# Patient Record
Sex: Female | Born: 1975 | Race: White | Hispanic: No | Marital: Single | Smoking: Never smoker
Health system: Southern US, Community
[De-identification: ages and names within clinical notes are randomized; demographics above are authoritative.]

## PROBLEM LIST (undated history)

## (undated) DIAGNOSIS — J189 Pneumonia, unspecified organism: Secondary | ICD-10-CM

## (undated) DIAGNOSIS — C50919 Malignant neoplasm of unspecified site of unspecified female breast: Secondary | ICD-10-CM

## (undated) HISTORY — PX: FRACTURE SURGERY: SHX138

## (undated) HISTORY — DX: Malignant neoplasm of unspecified site of unspecified female breast: C50.919

## (undated) HISTORY — PX: OTHER SURGICAL HISTORY: SHX169

## (undated) HISTORY — PX: AUGMENTATION MAMMAPLASTY: SUR837

---

## 2001-12-18 HISTORY — PX: AUGMENTATION MAMMAPLASTY: SUR837

## 2007-05-06 ENCOUNTER — Ambulatory Visit: Payer: Self-pay | Admitting: Internal Medicine

## 2007-05-08 ENCOUNTER — Inpatient Hospital Stay: Payer: Self-pay | Admitting: Internal Medicine

## 2007-05-11 ENCOUNTER — Encounter: Payer: Self-pay | Admitting: Cardiothoracic Surgery

## 2007-05-11 ENCOUNTER — Inpatient Hospital Stay (HOSPITAL_COMMUNITY): Admission: EM | Admit: 2007-05-11 | Discharge: 2007-05-17 | Payer: Self-pay | Admitting: Cardiothoracic Surgery

## 2007-05-11 ENCOUNTER — Ambulatory Visit: Payer: Self-pay | Admitting: Infectious Diseases

## 2007-05-12 ENCOUNTER — Ambulatory Visit: Payer: Self-pay | Admitting: Cardiothoracic Surgery

## 2007-05-30 ENCOUNTER — Ambulatory Visit: Payer: Self-pay | Admitting: Cardiothoracic Surgery

## 2007-06-06 ENCOUNTER — Ambulatory Visit: Payer: Self-pay | Admitting: Cardiothoracic Surgery

## 2007-06-06 ENCOUNTER — Encounter: Admission: RE | Admit: 2007-06-06 | Discharge: 2007-06-06 | Payer: Self-pay | Admitting: Cardiothoracic Surgery

## 2007-07-04 ENCOUNTER — Ambulatory Visit: Payer: Self-pay | Admitting: Cardiothoracic Surgery

## 2007-07-04 ENCOUNTER — Encounter: Admission: RE | Admit: 2007-07-04 | Discharge: 2007-07-04 | Payer: Self-pay | Admitting: Cardiothoracic Surgery

## 2008-05-18 HISTORY — PX: CHEST TUBE INSERTION: SHX231

## 2008-12-11 IMAGING — CT CT CHEST W/ CM
1 series · 15 of 33 positions shown, 19 images · non-contrast
Comparison: none

REASON FOR EXAM: empyema,s/p chest tube placement
COMMENTS:

[Series 2: soft tissue · axial · 0.68mm/px · z∈[-336,-62]mm · 15 of 65 slices shown, 19 images]
[im 5/65  mediastinal]
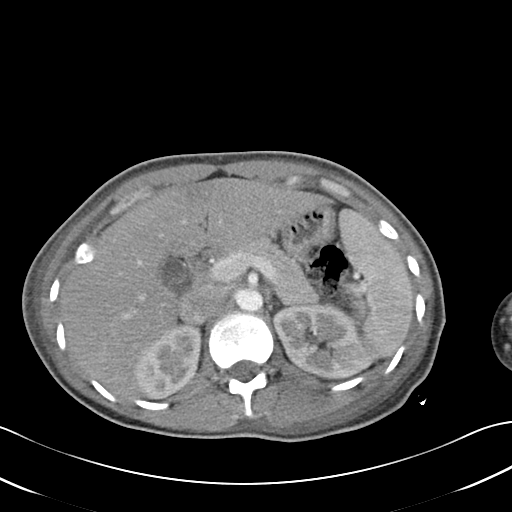
[im 5/65  lung]
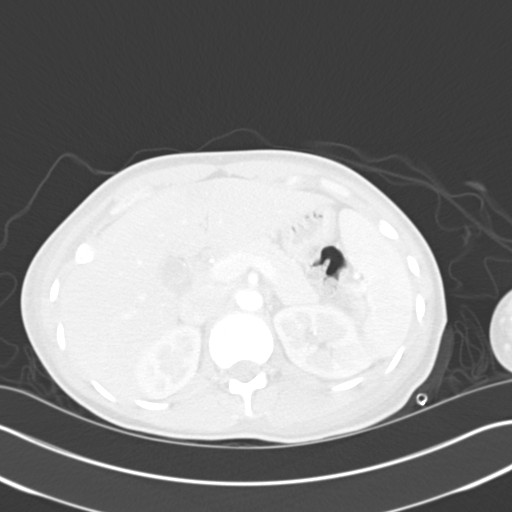
[im 10/65  lung]
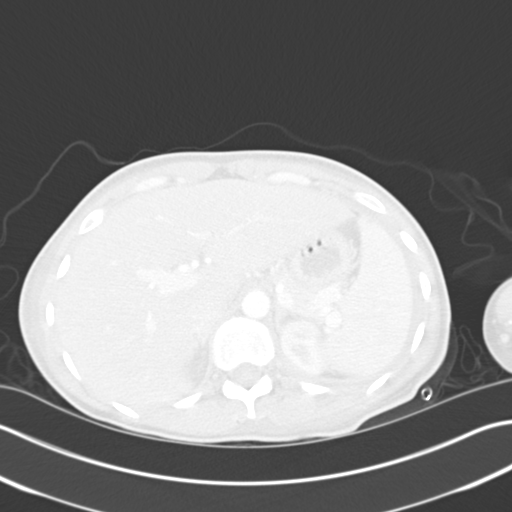
[im 13/65  lung]
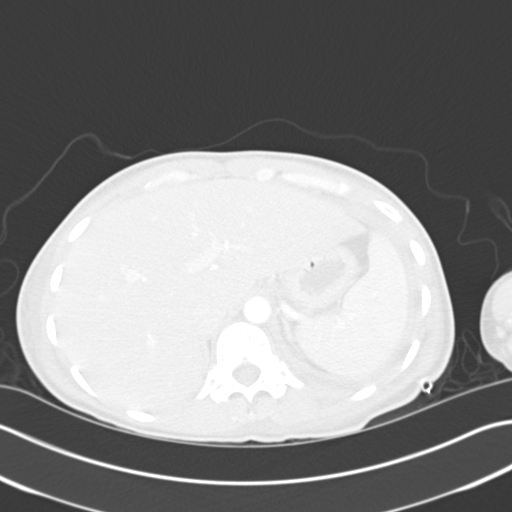
[im 17/65  lung]
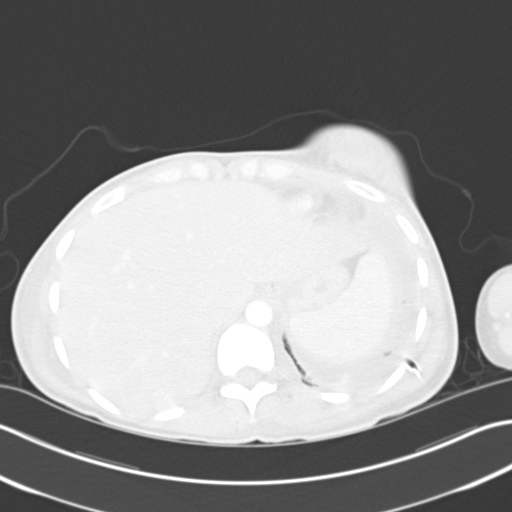
[im 22/65  mediastinal]
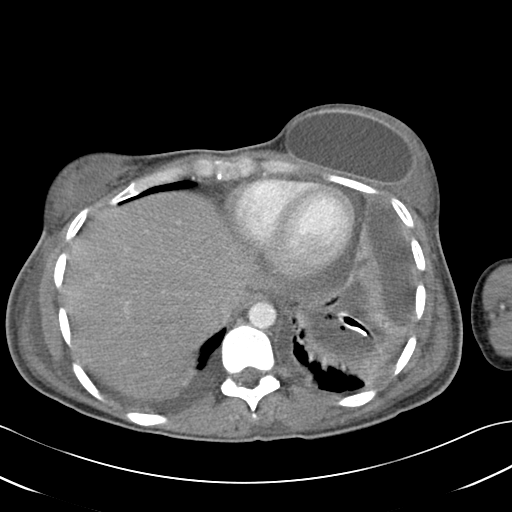
[im 22/65  lung]
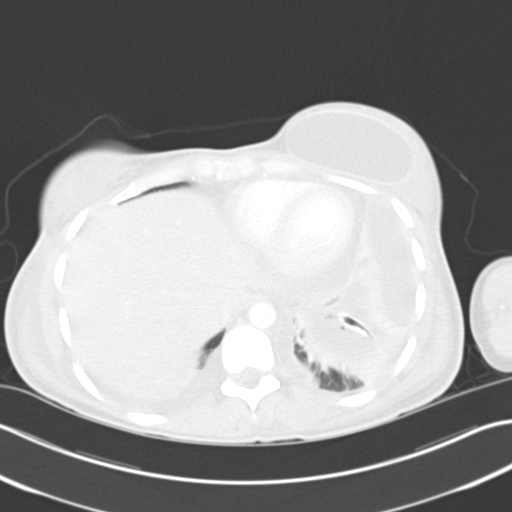
[im 26/65  lung]
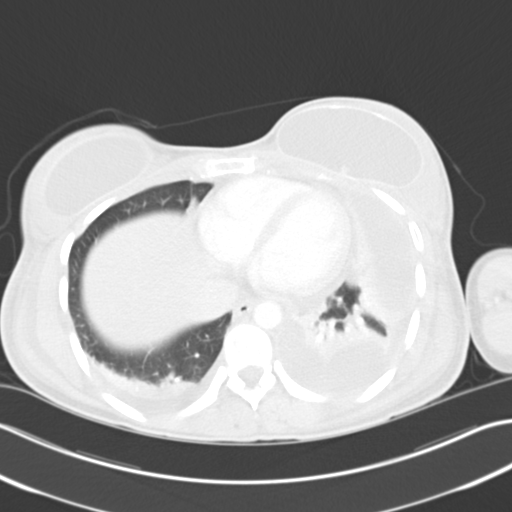
[im 29/65  lung]
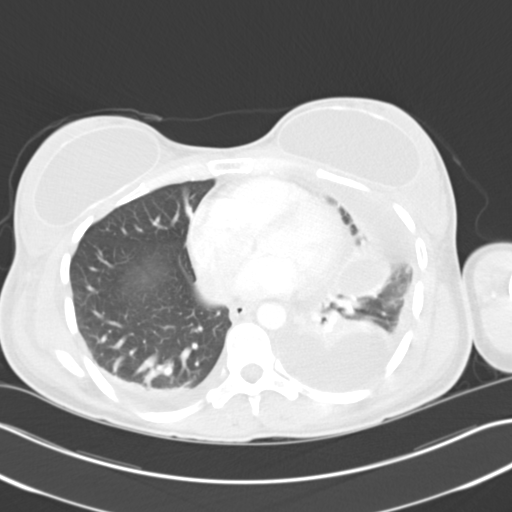
[im 34/65  lung]
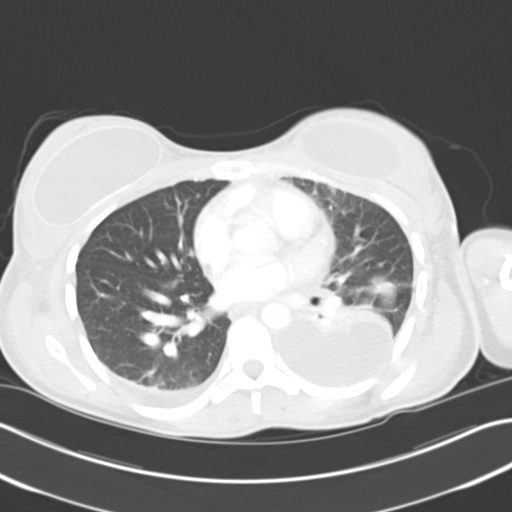
[im 36/65  mediastinal]
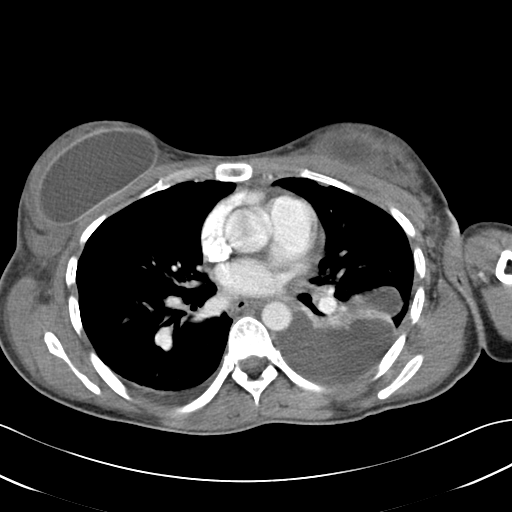
[im 36/65  lung]
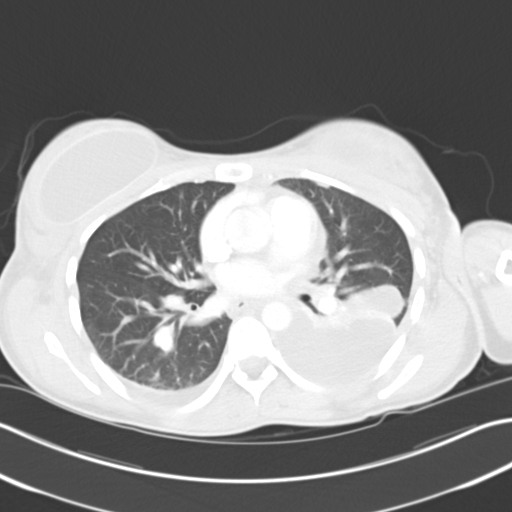
[im 39/65  lung]
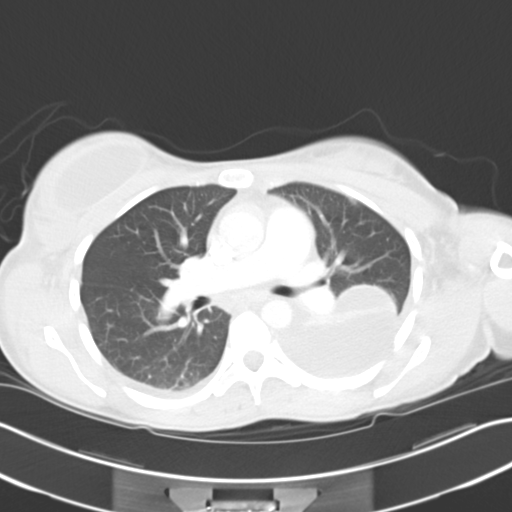
[im 43/65  lung]
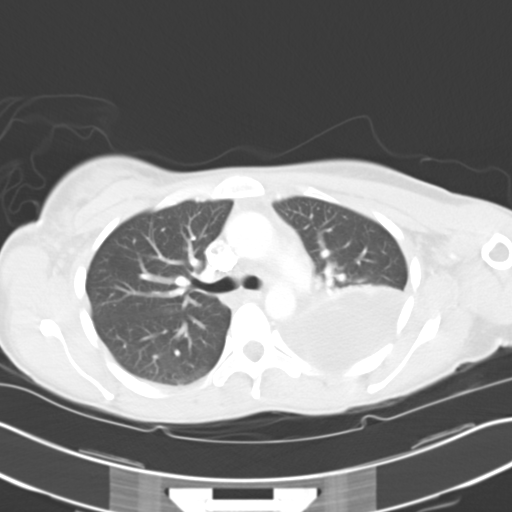
[im 48/65  lung]
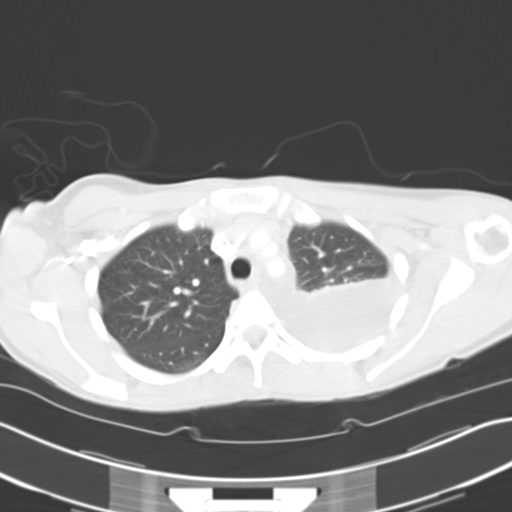
[im 52/65  mediastinal]
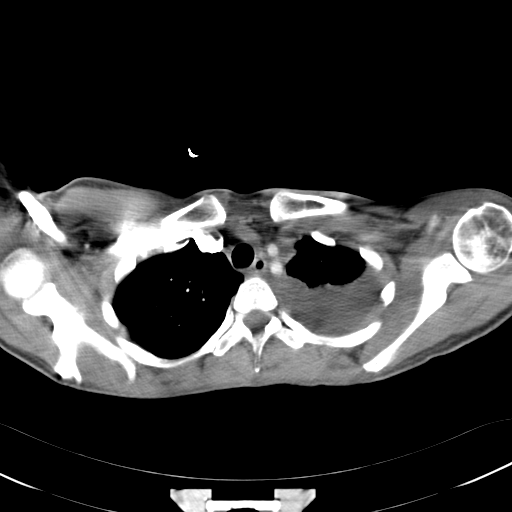
[im 52/65  lung]
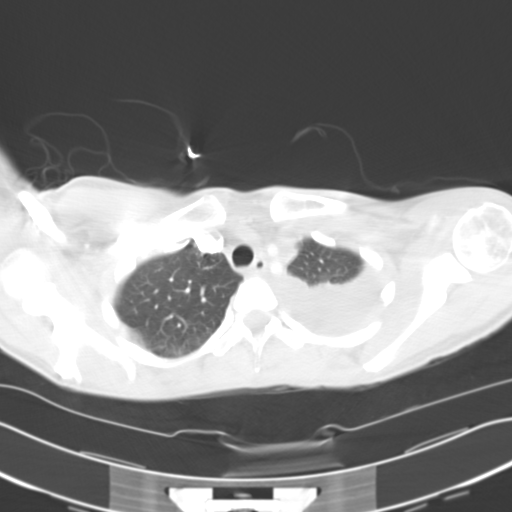
[im 55/65  lung]
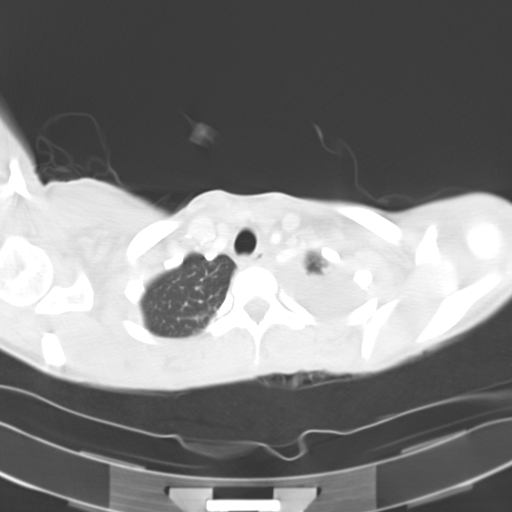
[im 60/65  lung]
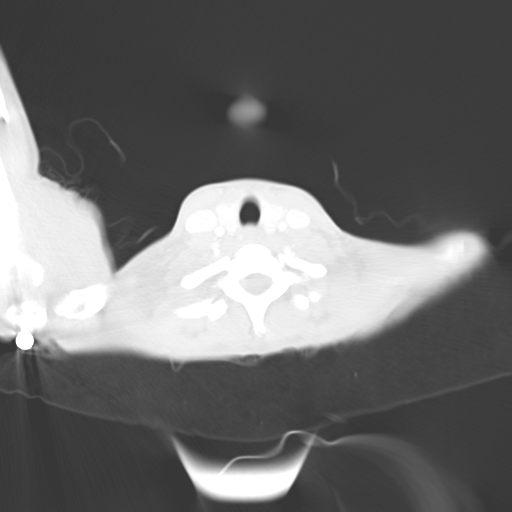

[15 of 33 positions shown; findings below may reference images not displayed]

PROCEDURE:     CT  - CT CHEST WITH CONTRAST  - May 11, 2007  [DATE]

RESULT:     The patient received 75 ml of 2sovue-SKS for this study. The
patent is undergoing re-evaluation of a known empyema. A chest tube has been
placed since the study [DATE]. The tip of the tube lies just above the
dome of the diaphragm along its mid portion. There has developed increased
fluid to a moderate degree in the posterior costophrenic gutter. It may be
that it is loculated and not being drained. There is more fluid that is
layering in the dependent portion of the hemithorax as one moves more
superiorly. Repositioning of the chest tube may be indicated. There is a
small amount of atelectasis and fluid that has developed in the RIGHT
posterior costophrenic gutter which is new. The cardiac chambers are normal
in size. The caliber of the thoracic aorta is normal. The central pulmonary
vascularity exhibits no acute abnormality. I see no bulky mediastinal or
hilar lymph nodes. Consolidation of portions of the LEFT lower lobe persists
and has progressed slightly. The RIGHT lung remains clear with the exception
of a small amount of atelectasis in the lung base.
IMPRESSION: 1.There is persistent pleural fluid on the LEFT in this patient with known
empyema. The chest tube does not appear to be draining significantly this
fluid. The fluid lies superior and posterior to the position of the tip of
the tube. There is also increased fluid seen laterally along the inferior
hemithorax that is not being drained by the tube.
2. Increased consolidation of portions of the LEFT lower lobe is seen
consistent with progressive pneumonia.
3. A new small RIGHT pleural effusion and small amount of RIGHT basilar
atelectasis is seen.
4. I see no overt evidence of CHF.

## 2008-12-11 IMAGING — CR DG CHEST 1V PORT
1 series · 1 of 1 positions shown · non-contrast
Comparison: None.

Exam: Chest, one view.

HISTORY: Empyema.

[view not recorded]
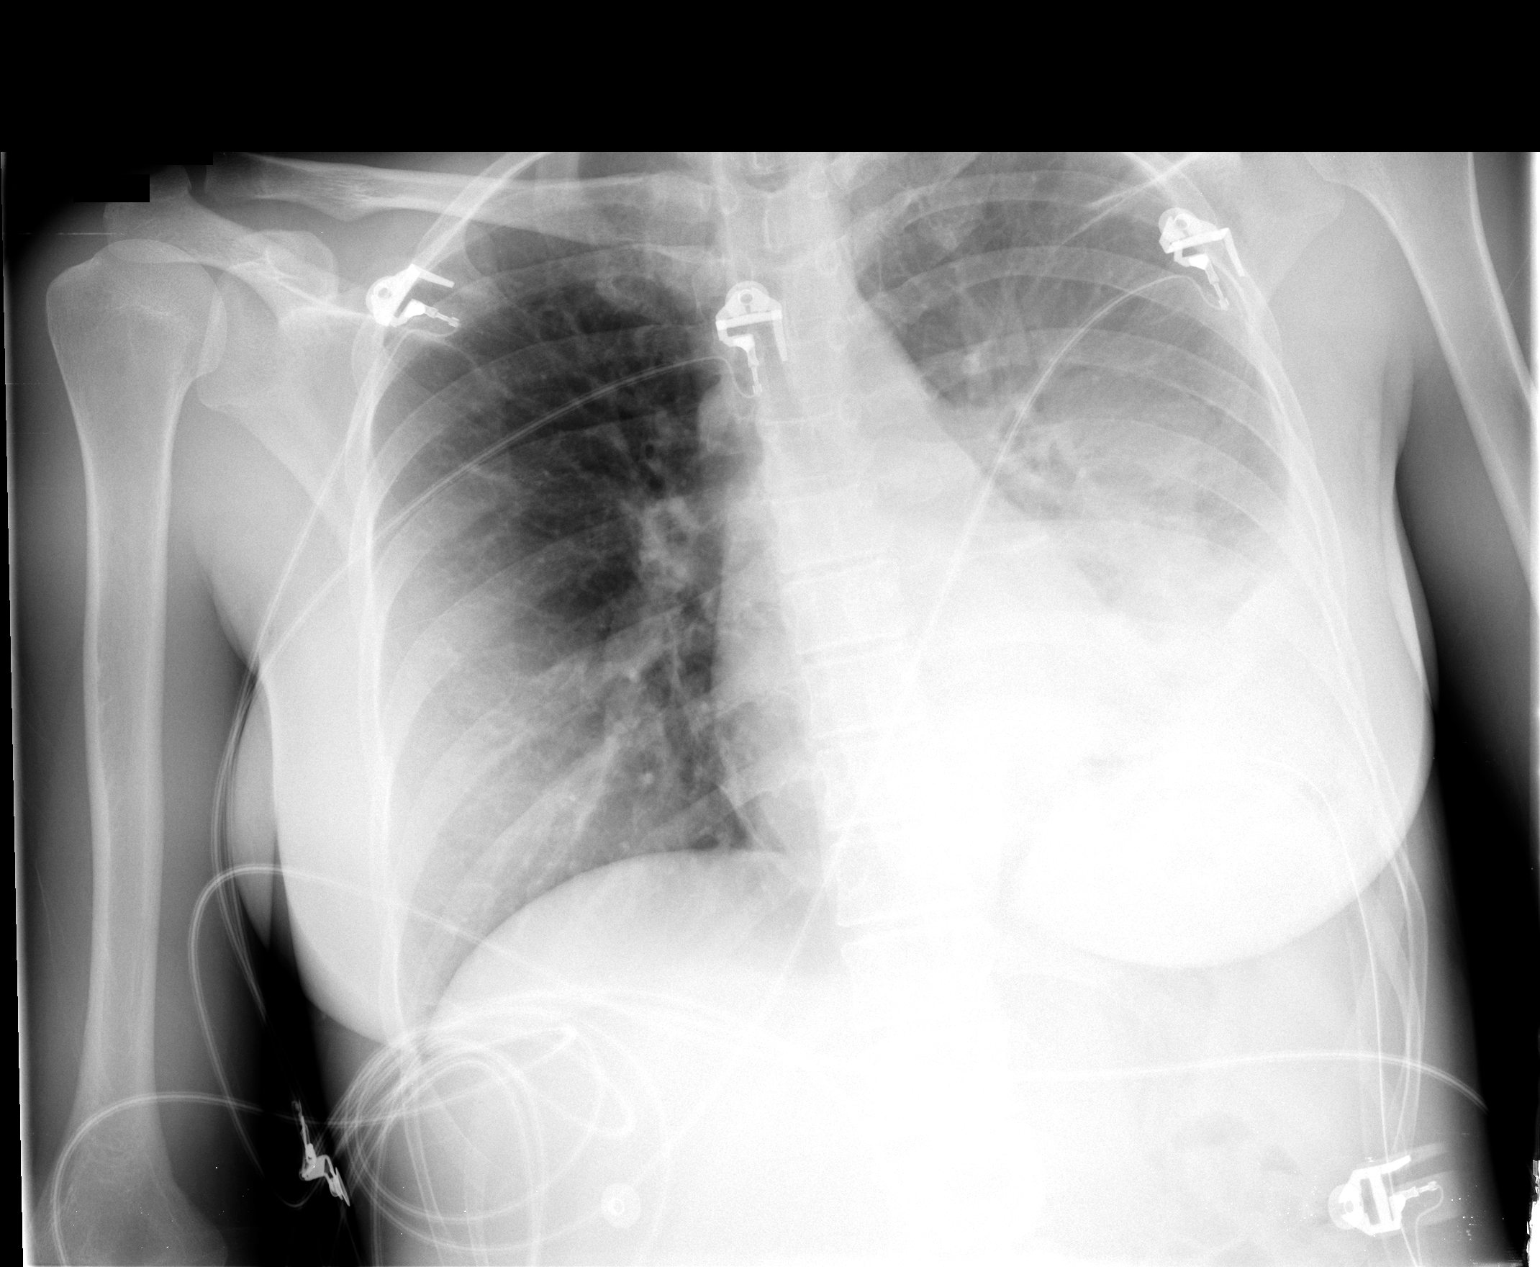

[1 of 1 positions shown; findings below may reference images not displayed]

FINDINGS: There is a left-sided chest tube.

No pneumothorax is identified. 

Moderate left-sided pleural effusion.

Left lower lobe atelectasis and/or consolidation is also noted.
IMPRESSION: 1. Moderate left effusion. 
2. No pneumothorax after chest tube placement.

## 2008-12-14 IMAGING — CR DG CHEST 1V PORT
1 series · 1 of 1 positions shown · non-contrast
Comparison: 05/13/2007

CLINICAL DATA: Empyema, postop

PORTABLE CHEST - 1 VIEW:

[view not recorded]
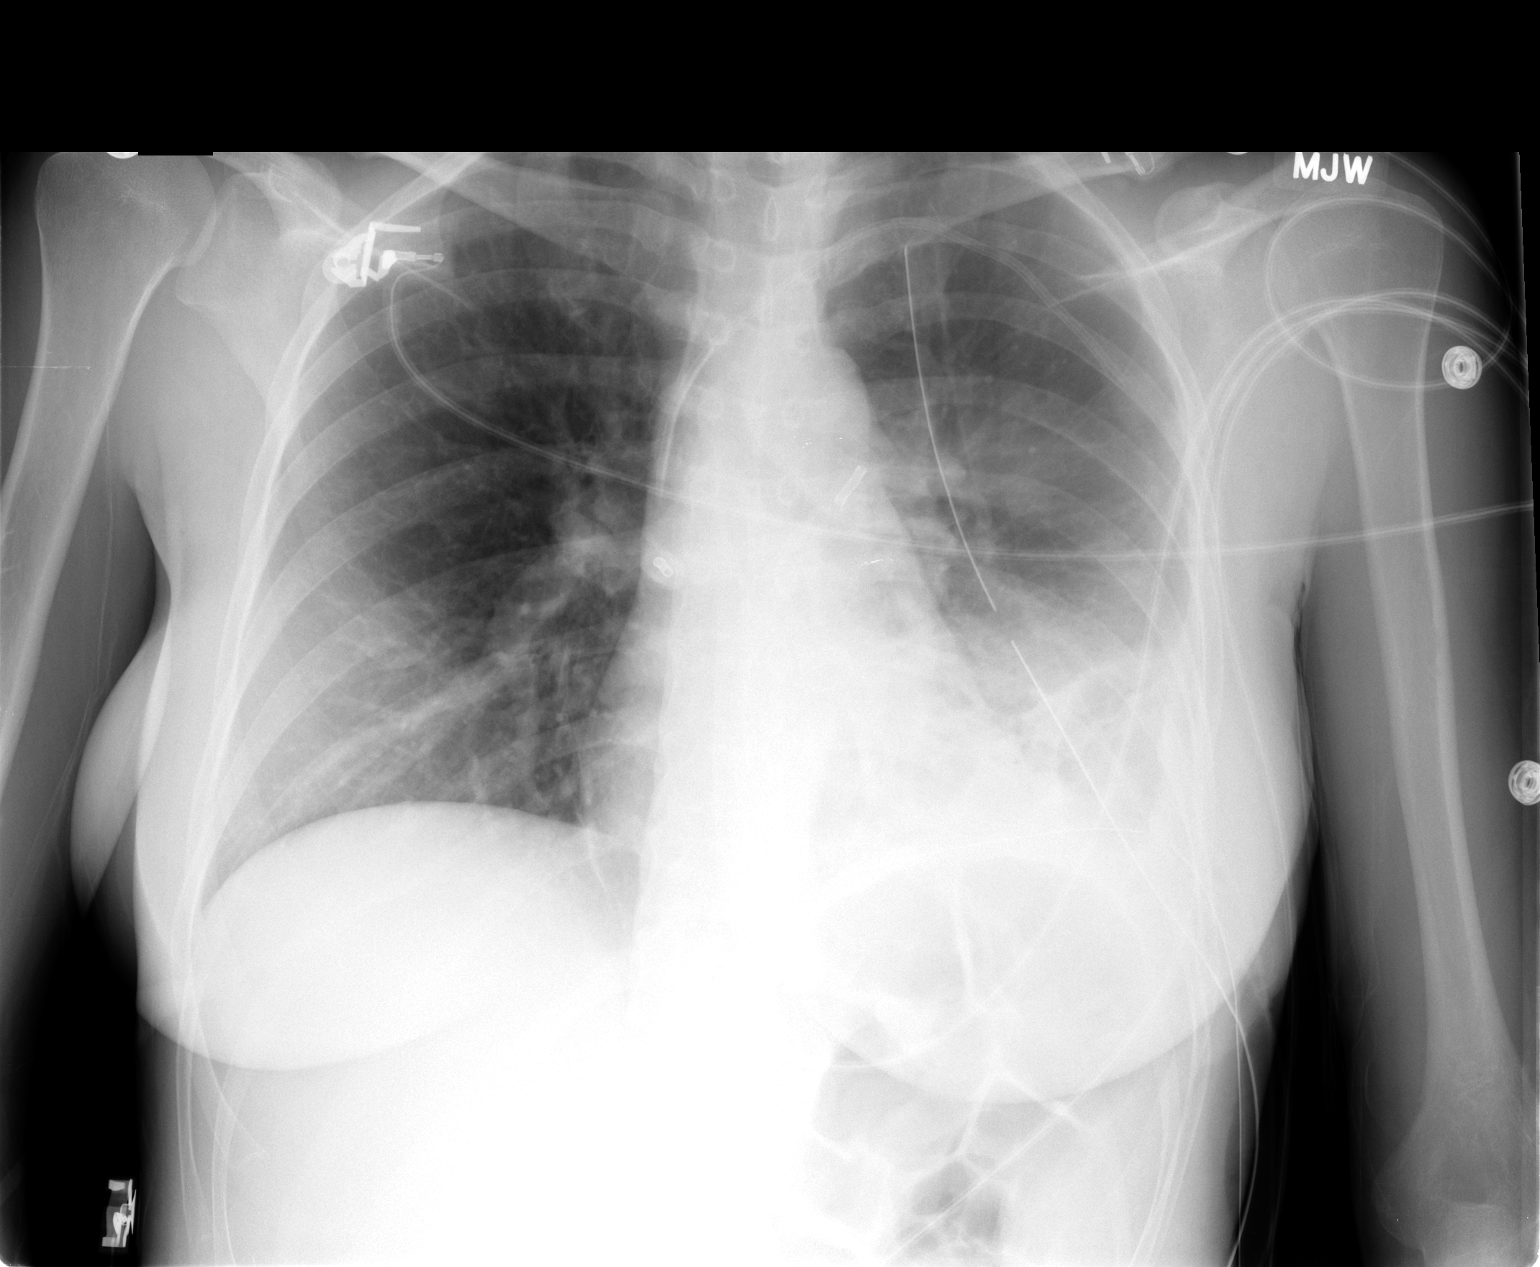

[1 of 1 positions shown; findings below may reference images not displayed]

FINDINGS: 2 left chest tubes remain in place. There is a tiny left apical
pneumothorax, less than 5%. Stable left lower lobe airspace opacity and left
effusion. Right lung clear. Heart is normal size.
IMPRESSION: Small left apical pneumothorax. Otherwise no change.

## 2008-12-17 IMAGING — CR DG CHEST 2V
2 series · 2 of 2 positions shown · non-contrast
Comparison: 05/16/2007.

CLINICAL DATA: Post-op, history of empyema.
CHEST - 2 VIEW:

[w chest pa]
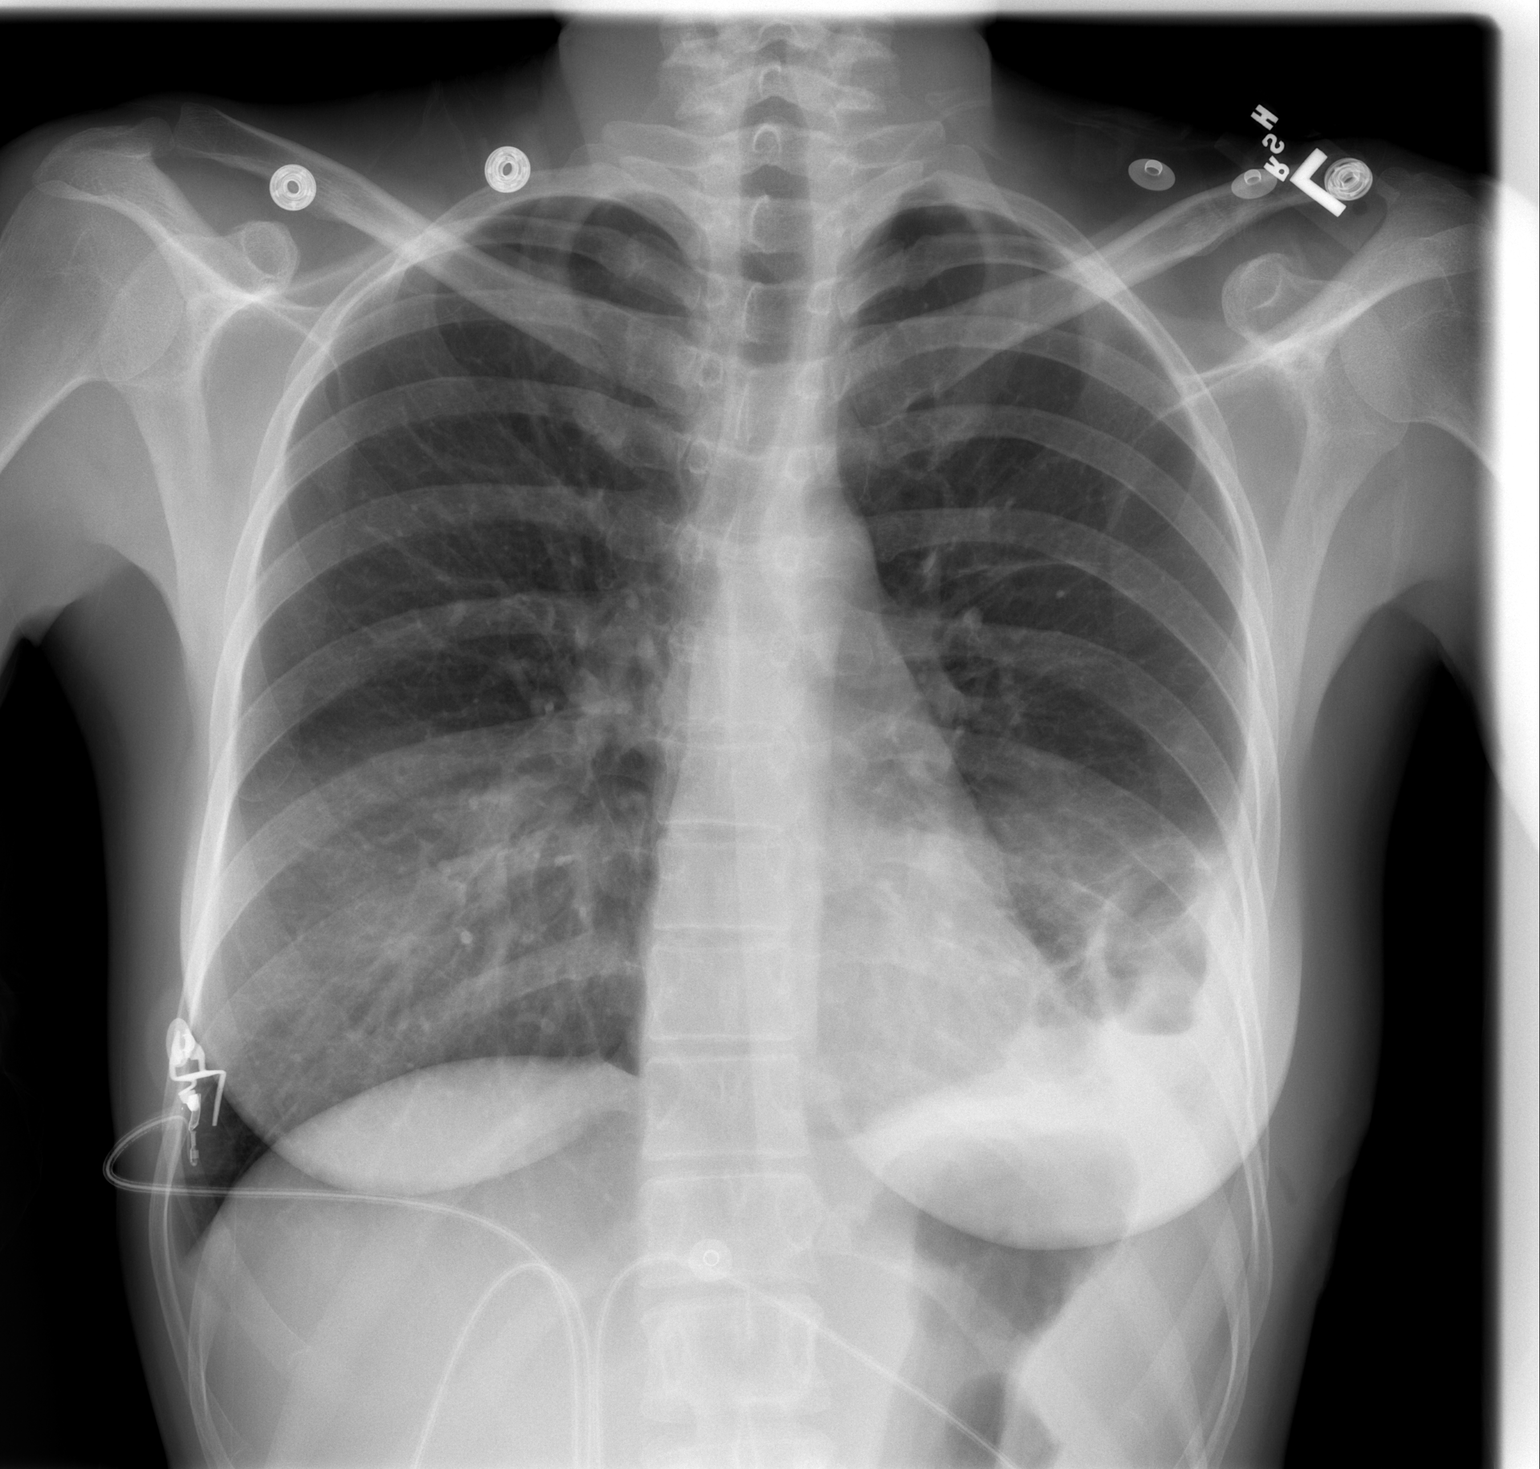

[w chest lat]
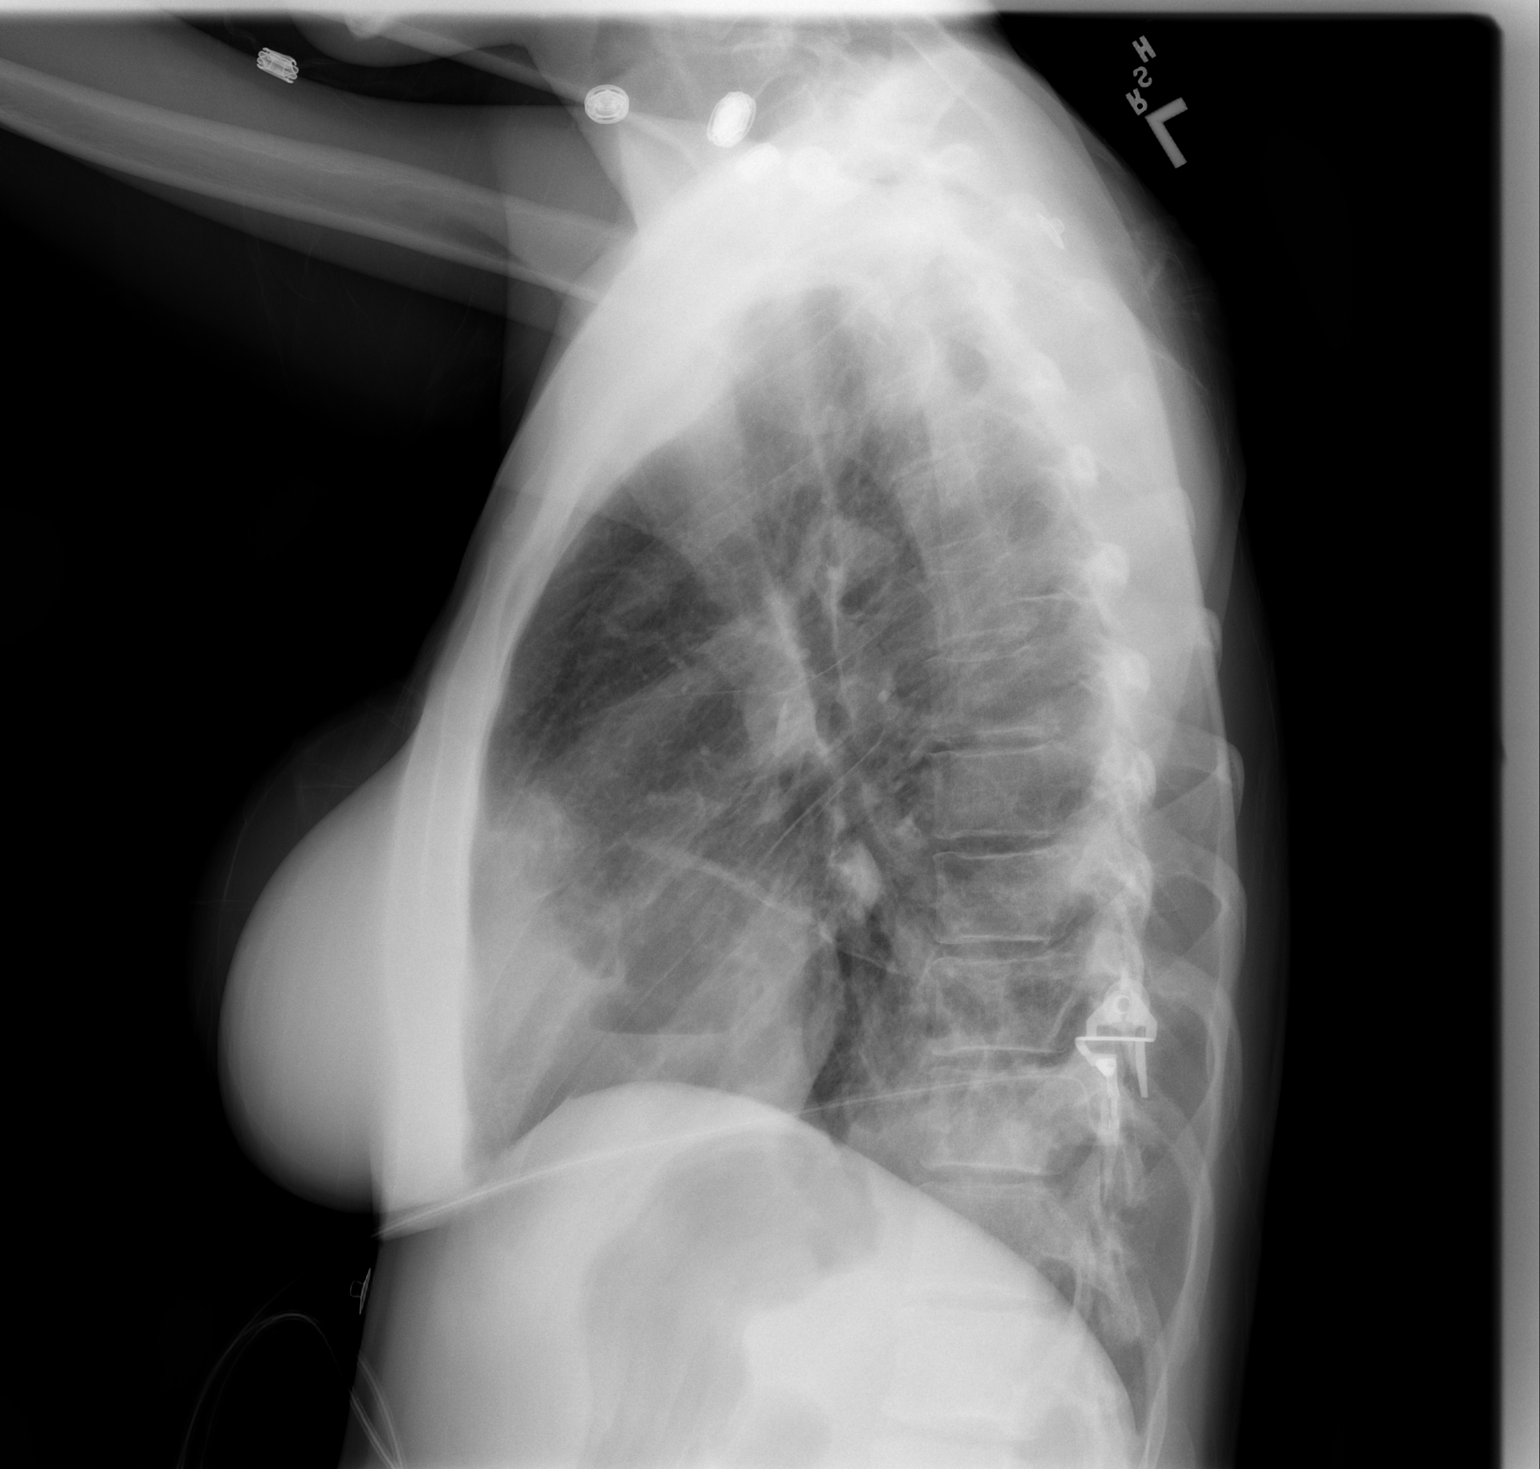

[2 of 2 positions shown; findings below may reference images not displayed]

FINDINGS: Trachea is midline.  Heart size normal.  Collection of pleural air and fluid is again seen at the left costophrenic angle.  Left basilar atelectasis.  Lungs otherwise clear.
IMPRESSION: Stable left hydropneumothorax in this patient with history of empyema.

## 2011-01-08 ENCOUNTER — Encounter: Payer: Self-pay | Admitting: Cardiothoracic Surgery

## 2011-05-02 NOTE — H&P (Signed)
Debra Shelton, ASPER NO.:  [id number]   MEDICAL RECORD NO.:  [id number]          PATIENT TYPE:  INP   LOCATION:  2037                         FACILITY:  [facility]   PHYSICIAN:  [name], MD    DATE OF BIRTH:  [birth year]   DATE OF ADMISSION:  05/11/2007  DATE OF DISCHARGE:                              HISTORY & PHYSICAL   STAT HISTORY AND PHYSICAL   Patient transferred from [facility].   REFERRING PHYSICIAN:  Dr. Santo Held, Pulmonary Service, [facility].   REASON FOR TRANSFER:  Left chest empyema.   HISTORY OF PRESENT ILLNESS:  The patient is a 35 year old female with no  previous significant medical history, who approximately three weeks ago  began having intermittent fevers, chills, and dry cough.  Approximately  two weeks ago, she developed increasing left back stabbing pain.  She  sought medical attention on several occasions as an outpatient and  approximately one week ago was started on antibiotics, Rocephin and  Avelox.  Over that time, she has had two CT scans, the most recent one  done today.  She was admitted to [facility] three days  ago because of worsening x-ray findings of left lower lobe pneumonia and  a question of empyema.  Because of this, a chest tube was placed, with  some drainage, but a followup CT scan today still shows significant  loculated area of presumed empyema.   PAST MEDICAL HISTORY:  The patient has had no previous medical problems,  denies diabetes, she is a nonsmoker, no history of asthma.   PAST SURGICAL HISTORY:  1. Left breast biopsy in '03.  2. Breast augmentation, June of '03.  3. One child born in January of '05.   SOCIAL AND FAMILY HISTORY:  The patient is employed full time.  There is  no family history of significant blood dyscrasias or medical problems.  The patient denies the possibility of pregnancy, does not know if a  pregnancy test was done at [facility].   ALLERGIES:  None known.  Prior to admission, she had been on no  medications.   REVIEW OF SYSTEMS:  Until the last current illness, the patient has had  no intermittent fever or chills, weight has been stable, no amaurosis or  TIAs, no polyuria or polydipsia, no blood in her stool or urine, no  psychiatric history.  Other review of systems are negative.   PHYSICAL EXAMINATION:  GENERAL:  The patient is pale looking but in no  acute distress and neurologically able to relate her history in great  detail.  VITAL SIGNS:  Temperature 97.7, heart rate 98, blood pressure 101/72,  respiratory rate 16.  HEENT:  Pupils equal, round, and reactive to light.  NECK:  Without carotid bruits.  LUNGS:  She has decreased breath sounds over the left base.  A left  posterior chest tube is in place.  The right lung is clear without  wheezing.  ABDOMEN:  Exam is benign.  EXTREMITIES:  She has no calf tenderness.   CT scan from  today and the 19th from [place] are reviewed, and patient  has left lower lobe atelectasis with probable empyema in the surrounding  left lower lobe with a chest tube in place, but not draining  significantly.   IMPRESSION:  Patient with left lower lobe pneumonia and now empyema  progressing over the past two weeks.  Because of the patient's symptoms  and radiographic findings, I have recommended proceeding with video-  assisted thoracoscopy, drainage of empyema, and placement of further  chest tubes.  The risks of surgery including death, infection, pulmonary  embolus, prolonged air leak, and blood transfusion are all discussed  with the patient, and she is willing to proceed.  We will tentatively  arrange for surgery early tomorrow morning, Sunday, May 12, 2007.      [name], MD  Electronically Signed     EG/MEDQ  D:  05/11/2007  T:  05/11/2007  Job:  [id number]

## 2011-05-02 NOTE — Assessment & Plan Note (Signed)
OFFICE VISIT   Debra Shelton, Debra Shelton  DOB:  Jul 24, 1976                                        June 06, 2007  CHART #:  [id number]   Patient returns today after being referred from [facility] with  chest tube in place for a left empyema. The patient has made excellent  progress since being discharged home. She denies fevers, chills, or  night sweats. Her strength has rapidly returned to near normal levels.  She has returned to work without difficultly and is interesting and is  starting to increase her exercise regimen.   On exam, her blood pressure is 111/76, pulse is 80 and regular,  respiratory rate 18, O2 sats 99%. Her lungs are clear bilaterally. The  left chest incision and chest tube sites are all well-healed without  evidence of infection. She has no discomfort there. She does note some  minor dermatomal numbness in the anterior chest along the dermatoma of  the incision.   Follow up chest x-ray shows significant clearing of the lung process  that was present in the left chest previously. There is still some  pleural reaction along the left chest wall but no pneumothorax.   I did discuss with the pulmonary infection that the patient should pay  increased attention to obtaining a flu vaccination yearly, and also  discuss with her medical doctor a pneumococcal vaccination. I plan to  see her back in 4 to 5 weeks with a follow up chest x-ray.   [name], MD  Electronically Signed   EG/MEDQ  D:  06/06/2007  T:  06/06/2007  Job:  [id number]   cc:   Lacretia Leigh. Ninetta Lights, M.D.  Sharyl Nimrod, M.D.

## 2011-05-02 NOTE — Discharge Summary (Signed)
NAMEBRITANIE, HARSHMAN                  ACCOUNT NO.:  [id number]   MEDICAL RECORD NO.:  [id number]          PATIENT TYPE:  INP   LOCATION:  2006                         FACILITY:  [facility]   PHYSICIAN:  [name], MD    DATE OF BIRTH:  [birth year]   DATE OF ADMISSION:  05/11/2007  DATE OF DISCHARGE:                               DISCHARGE SUMMARY   ANTICIPATED. DATE OF DISCHARGE:  May 17, 2007.   ADMISSION DIAGNOSIS:  Left lower lobe pneumonia with empyema.   DISCHARGE/SECONDARY DIAGNOSES:  1. Left lower lobe pneumonia with empyema status post drainage and      decortication (Peptostreptococcus pneumonia).  2. Postoperative consultation.  3. History of left breast biopsy in 2003.  4. History of breast augmentation in June 2003.   ALLERGIES:  NO KNOWN DRUG ALLERGIES.   PROCEDURES:  Left video assisted thorascopy with left thoracotomy for  drainage of empyema and for decortication, and placement of On-Q pain  management device by Dr. [name] on May 11, 2007.   BRIEF HISTORY:  [name] is a 35 year old female with no previous  significant medical problems.  Who 3 weeks prior to admission began  having intermittent fevers, chills, and dry cough.  About 1 week later,  she developed increasing left back stabbing pain.  She sought medical  attention on several occasions as an outpatient and approximately 1 week  prior to admission, she was started on antibiotics, Rocephin and Avelox.  Over that time, she had 2 CT scans.  The most recent on May 11, 2007.  She had been admitted to [facility] on May 08, 2007  because of worsening x-ray findings of left lower lobe pneumonia and  question of empyema.  A chest tube was placed, but with minimal  drainage, so a follow up CT scan was done on May 11, 2007 which showed  significant loculated area of presumed empyema.  Dr. Tyrone Sage from [facility] hospital was consulted and agreed to accept the patient in transfer  for  possible surgical intervention.   HOSPITAL COURSE:  [name] was admitted to [facility] hospital May 11, 2007 in transfer from [facility].  It was ultimately  felt that a thoracotomy or a left VATS, the thoracotomy was the best  treatment option for drainage of her empyema.  She agreed to proceed and  this was done on May 12, 2007.  In the meantime she was on Rocephin and  azithromycin.  Postoperatively, infectious disease was consulted to  assist with anti-infective regimen.  She was seen by Dr. Ninetta Lights who  felt initial findings of the presentation suggested MRSA or strep.  He  recommended to await cultures and in the meantime, azithromycin was  discontinued and she was started on IV vancomycin.  Cultures ultimately  showed the Peptostreptococcus species.  Anaerobic cultures have been  negative thus far.  Lactobacillus and fungal cultures have also been  negative so far.  Postoperatively, [name] was sent to the surgical  intensive care unit.  She remained there postoperative day 2.  She  remained stable and neurologically intact, although was sometimes  tachycardic with heart rate up to 120.  Her pain was initially managed  with a morphine PCA as well as an On-Q device that was placed  intraoperatively.  These were both eventually discontinued and pain was  managed on oral Norco.  Following her stay in the [facility] she was  ultimately transferred to the step-down unit in telemetry unit 2000 in  anticipation for management of discharge.  She received vancomycin and  Rocephin until final cultures were known and then changed to Augmentin  for approximately 10 more days of antibiotic therapy.  She remained  stable and without any high fevers.  She was weaned from supplemental  oxygen and saturating 94% on room air.  Her chest tubes were removed by  May 16, 2007 with chest x-ray showing development of left sided pleural  gas and fluid collection most consistent with  residual empyema.  There  is a proved adjacent left sided airspace disease.  There was no  significant pneumothorax.  Since chest tubes were out and the patient  remained stable, it was felt she would be ready for discharge home soon.  Her tachycardia had improved with her heart rate primarily in the 90s  and in sinus rhythm.  She did have a problem with constipation which was  treated with laxatives and stool softeners.   DISPOSITION:  If there are no significant changes in Miss Moon's status,  it is anticipated she will be ready for discharge on May 17, 2007.   LABORATORY DATA:  Culture results as previously dictated.  Her  postoperative labs show a sodium of 138, potassium 4.7, chloride 94, CO2  36, blood glucose 108, BUN 4, creatinine 0.61.  Total bilirubin was 0.7.  Alkaline phosphatase elevated at 294, but AST and ALT were normal at 34  and 35 respectively.  Her total protein was 6.3.  Blood albumin 2.1.  Calcium 8.4.  White blood count of 9.7, hemoglobin 12, hematocrit 35.5,  platelet count 495,000.   DISCHARGE MEDICATIONS:  1. Augmentin 875 mg p.o. b.i.d. times seven more days.  2. Norco 5/325 mg one to two tablets p.o. every four hours p.r.n.      pain.   DISCHARGE INSTRUCTIONS:  She is to increase her activity slowly.  She  should avoid driving or heavy lifting for 2 weeks.  She may shower and  clean her incision with soap and water.  Call if she develops fever  greater than 101, a reddish drainage from her incision site, or  shortness of breath.  She can resume a regular diet.  She is to follow  up with Dr. Tyrone Sage at the [facility] office on May 30, 2007 at 12:45 p.m.  and to have a chest x-ray at [facility] at 11:45 a.m. that same  day.      Jerold Coombe, P.A.      [name], MD  Electronically Signed    AWZ/MEDQ  D:  05/16/2007  T:  05/16/2007  Job:  [id number]   cc:   Lacretia Leigh. Ninetta Lights, M.D.

## 2011-05-02 NOTE — Assessment & Plan Note (Signed)
OFFICE VISIT   SYDNEY, HASTEN  DOB:  1976/11/16                                        July 04, 2007  CHART #:  [id number]   HISTORY OF PRESENT ILLNESS:  The patient now returns for an office visit  approximately 1 month from her previous visit.  At that time her chest x-  ray showed significant clearing of the lung process that was present in  the left chest but there was some pleural reaction along the chest wall.  She obtained a followup chest x-ray on today's date and this now reveals  complete resolution of her left empyema and no acute processes noted.  She is asymptomatic, feeling quite well.   PHYSICAL EXAMINATION:  Blood pressure 103/66, pulse 70, respirations 18,  oxygen saturation 100% room air.  Pulmonary examination:  Breath sounds  are clear without rales, rhonchi, or wheeze.  Cardiac examination:  Regular rate and rhythm.  Surgical incisions are all well-healed with no  evidence of infection.   ASSESSMENT:  The patient is now approximately 2 months following her  left empyema, status post video-assisted thoracoscopy with decortication  and drainage.  She is doing quite well.  She will not need to be seen  again regarding this issue and will be seen on a p.r.n. basis.   Rowe Clack, P.A.-C.   Sherryll Burger  D:  07/04/2007  T:  07/05/2007  Job:  [id number]   cc:   [name], MD  Lacretia Leigh. Ninetta Lights, M.D.  [employer]

## 2011-05-02 NOTE — Op Note (Signed)
Debra Shelton, CLERMONT                  ACCOUNT NO.:  [id number]   MEDICAL RECORD NO.:  [id number]          PATIENT TYPE:  INP   LOCATION:  2550                         FACILITY:  [facility]   PHYSICIAN:  [name], MD    DATE OF BIRTH:  February 10, 1976   DATE OF PROCEDURE:  05/12/2007  DATE OF DISCHARGE:                               OPERATIVE REPORT   PREOPERATIVE DIAGNOSIS:  Left empyema/pneumonia.   POSTOPERATIVE DIAGNOSIS:  Left empyema/pneumonia.   PROCEDURE:  Left video-assisted thoracoscopy/thoracotomy, drainage of  empyema and decortication, placement of On-Q pain management device.   SURGEON:  [name], M.D.   BRIEF HISTORY:  The patient is a 35 year old female with an  approximately 3-week history of intermittent fevers, chills, and left  chest pain.  Ultimately, she was admitted to [facility] 2 days  ago with progressive left pleural effusion and left lower lobe  pneumonia.  CT scan was performed yesterday after placement of chest  tube that showed inadequately drained areas consistent with empyema.  The patient was transferred for consideration of surgical drainage.  On  reviewing the CT scans, I was in agreement with the recommendation of  the pulmonologist, Dr. Welton Flakes, and we proceeded with drainage of empyema  and cortication.   DESCRIPTION OF PROCEDURE:  The patient underwent general endotracheal  anesthesia with a double-lumen endotracheal tube without difficulty.  The previously placed chest tube which she was transferred here with was  removed.  The left chest was prepped with Betadine and draped in the  usual sterile manner.   In the mid-axillary line in approximately the fourth to fifth  intercostal space, a trocar was introduced into the chest.  There was a  significant amount of both free fluid and a very significant amount of  necrotic debris because of multiple adhesions, and it was not possible  to fully drain the chest through a VATS only.  A  small thoracotomy  incision was made, and through this, complete drainage of pleural fluid  both anterior and posterior was carried out.  In addition, debridement  and decortication of peel on the lung was carefully performed.  Gram  stain on this material showed gram-positive cocci in pairs.  With the  lung freed and as much as possible the peel removed, a posterior right-  angle tube was placed and a 28 straight chest tube was placed into the  left chest.  A single pericostal stitch was placed around the ribs.  The  muscle layers were closed with interrupted 0 Vicryl sutures,  subcutaneous tissues with a running 2-0 Vicryl, and a 3-0 subcuticular  stitch on the skin edges.  Dry dressings were applied.  A single limb On-  Q device was tunneled subcutaneously in the vicinity of the incision and  chest tube insertion site and attached to the On-Q reservoir.   The patient tolerated the procedure without obvious complications.  Estimated blood loss was approximately 100-150 mL.  The patient was  extubated in the operating room and transferred to the recovery room for  postoperative care.  [name], MD  Electronically Signed     EG/MEDQ  D:  05/12/2007  T:  05/12/2007  Job:  [id number]   cc:   Freda Munro

## 2020-02-21 ENCOUNTER — Ambulatory Visit: Payer: Self-pay | Attending: Internal Medicine

## 2020-02-21 ENCOUNTER — Other Ambulatory Visit: Payer: Self-pay

## 2020-02-21 DIAGNOSIS — Z23 Encounter for immunization: Secondary | ICD-10-CM

## 2020-02-21 NOTE — Progress Notes (Signed)
   Covid-19 Vaccination Clinic  Name:  Debra Shelton    MRN: [id number] DOB: 10-30-1976  02/21/2020  Debra Shelton was observed post Covid-19 immunization for 15 minutes without incident. She was provided with Vaccine Information Sheet and instruction to access the V-Safe system.   Debra Shelton was instructed to call 911 with any severe reactions post vaccine: Marland Kitchen Difficulty breathing  . Swelling of face and throat  . A fast heartbeat  . A bad rash all over body  . Dizziness and weakness   Immunizations Administered    Name Date Dose VIS Date Route   Pfizer COVID-19 Vaccine 02/21/2020  9:59 AM 0.3 mL 11/28/2019 Intramuscular   Manufacturer: [employer], [employer]   Lot: [id number]   NDC: [address]-3

## 2020-03-16 ENCOUNTER — Ambulatory Visit: Payer: 59 | Attending: Internal Medicine

## 2020-03-16 DIAGNOSIS — Z23 Encounter for immunization: Secondary | ICD-10-CM

## 2020-03-16 NOTE — Progress Notes (Signed)
   Covid-19 Vaccination Clinic  Name:  Debra Shelton    MRN: [id number] DOB: [birth year]  03/16/2020  Debra Shelton was observed post Covid-19 immunization for 15 minutes without incident. She was provided with Vaccine Information Sheet and instruction to access the V-Safe system.   Debra Shelton was instructed to call 911 with any severe reactions post vaccine: Marland Kitchen Difficulty breathing  . Swelling of face and throat  . A fast heartbeat  . A bad rash all over body  . Dizziness and weakness   Immunizations Administered    Name Date Dose VIS Date Route   Pfizer COVID-19 Vaccine 03/16/2020  8:34 AM 0.3 mL 11/28/2019 Intramuscular   Manufacturer: [employer], [employer]   Lot: [id number]   NDC: [address]-4

## 2020-07-28 ENCOUNTER — Other Ambulatory Visit: Payer: Self-pay

## 2020-07-28 ENCOUNTER — Other Ambulatory Visit
Admission: RE | Admit: 2020-07-28 | Discharge: 2020-07-28 | Disposition: A | Discharge status: Home / Self Care | Payer: 59 | Source: Ambulatory Visit | Attending: Surgery | Admitting: Surgery

## 2020-07-28 ENCOUNTER — Other Ambulatory Visit: Payer: Self-pay | Admitting: Surgery

## 2020-07-28 DIAGNOSIS — Z01812 Encounter for preprocedural laboratory examination: Secondary | ICD-10-CM | POA: Diagnosis present

## 2020-07-28 DIAGNOSIS — Z20822 Contact with and (suspected) exposure to covid-19: Secondary | ICD-10-CM | POA: Insufficient documentation

## 2020-07-29 ENCOUNTER — Ambulatory Visit: Payer: 59 | Admitting: Certified Registered"

## 2020-07-29 ENCOUNTER — Ambulatory Visit
Admission: RE | Admit: 2020-07-29 | Discharge: 2020-07-29 | Disposition: A | Discharge status: Home / Self Care | Payer: 59 | Attending: Orthopedic Surgery | Admitting: Orthopedic Surgery

## 2020-07-29 ENCOUNTER — Encounter: Payer: Self-pay | Admitting: Orthopedic Surgery

## 2020-07-29 ENCOUNTER — Other Ambulatory Visit: Payer: Self-pay

## 2020-07-29 ENCOUNTER — Encounter
Admission: RE | Disposition: A | Discharge status: Home / Self Care | Payer: Self-pay | Source: Home / Self Care | Attending: Orthopedic Surgery

## 2020-07-29 ENCOUNTER — Ambulatory Visit: Payer: 59

## 2020-07-29 DIAGNOSIS — Y9368 Activity, volleyball (beach) (court): Secondary | ICD-10-CM | POA: Insufficient documentation

## 2020-07-29 DIAGNOSIS — W500XXA Accidental hit or strike by another person, initial encounter: Secondary | ICD-10-CM | POA: Insufficient documentation

## 2020-07-29 DIAGNOSIS — S62615A Displaced fracture of proximal phalanx of left ring finger, initial encounter for closed fracture: Secondary | ICD-10-CM | POA: Insufficient documentation

## 2020-07-29 HISTORY — DX: Pneumonia, unspecified organism: J18.9

## 2020-07-29 HISTORY — PX: OPEN REDUCTION INTERNAL FIXATION (ORIF) PROXIMAL PHALANX: SHX6235

## 2020-07-29 LAB — SARS CORONAVIRUS 2 (TAT 6-24 HRS): SARS Coronavirus 2: NEGATIVE

## 2020-07-29 LAB — POCT PREGNANCY, URINE: Preg Test, Ur: NEGATIVE

## 2020-07-29 SURGERY — OPEN REDUCTION INTERNAL FIXATION (ORIF) PROXIMAL PHALANX
Anesthesia: General | Site: Finger | Laterality: Left

## 2020-07-29 MED ORDER — ONDANSETRON HCL 4 MG/2ML IJ SOLN: 4.0000 mg | Freq: Four times a day (QID) | INTRAMUSCULAR | Status: DC | PRN

## 2020-07-29 MED ORDER — CHLORHEXIDINE GLUCONATE 0.12 % MT SOLN: 15.0000 mL | Freq: Once | OROMUCOSAL | Status: AC

## 2020-07-29 MED ORDER — LIDOCAINE HCL (CARDIAC) PF 100 MG/5ML IV SOSY
PREFILLED_SYRINGE | INTRAVENOUS | Status: DC | PRN
  Administered 2020-07-29: 60 mg via INTRAVENOUS

## 2020-07-29 MED ORDER — ACETAMINOPHEN 10 MG/ML IV SOLN
INTRAVENOUS | Status: AC
  Filled 2020-07-29: qty 100

## 2020-07-29 MED ORDER — SODIUM CHLORIDE 0.9 % IV SOLN: INTRAVENOUS | Status: DC

## 2020-07-29 MED ORDER — CEFAZOLIN SODIUM-DEXTROSE 2-4 GM/100ML-% IV SOLN
INTRAVENOUS | Status: AC
  Filled 2020-07-29: qty 100

## 2020-07-29 MED ORDER — HYDROCODONE-ACETAMINOPHEN 5-325 MG PO TABS
ORAL_TABLET | ORAL | Status: AC
  Administered 2020-07-29: 1 via ORAL
  Filled 2020-07-29: qty 1

## 2020-07-29 MED ORDER — FENTANYL CITRATE (PF) 100 MCG/2ML IJ SOLN
INTRAMUSCULAR | Status: DC | PRN
  Administered 2020-07-29 (×2): 50 ug via INTRAVENOUS

## 2020-07-29 MED ORDER — MIDAZOLAM HCL 2 MG/2ML IJ SOLN
INTRAMUSCULAR | Status: DC | PRN
  Administered 2020-07-29: 2 mg via INTRAVENOUS

## 2020-07-29 MED ORDER — ORAL CARE MOUTH RINSE: 15.0000 mL | Freq: Once | OROMUCOSAL | Status: AC

## 2020-07-29 MED ORDER — MIDAZOLAM HCL 2 MG/2ML IJ SOLN
INTRAMUSCULAR | Status: AC
  Filled 2020-07-29: qty 2

## 2020-07-29 MED ORDER — ACETAMINOPHEN 10 MG/ML IV SOLN
INTRAVENOUS | Status: DC | PRN
  Administered 2020-07-29: 1000 mg via INTRAVENOUS

## 2020-07-29 MED ORDER — BUPIVACAINE HCL (PF) 0.5 % IJ SOLN: INTRAMUSCULAR | Status: DC | PRN

## 2020-07-29 MED ORDER — METOCLOPRAMIDE HCL 5 MG/ML IJ SOLN: 5.0000 mg | Freq: Three times a day (TID) | INTRAMUSCULAR | Status: DC | PRN

## 2020-07-29 MED ORDER — ONDANSETRON HCL 4 MG/2ML IJ SOLN
INTRAMUSCULAR | Status: AC
  Filled 2020-07-29: qty 2

## 2020-07-29 MED ORDER — PROPOFOL 10 MG/ML IV BOLUS
INTRAVENOUS | Status: DC | PRN
  Administered 2020-07-29: 150 mg via INTRAVENOUS

## 2020-07-29 MED ORDER — CEFAZOLIN SODIUM-DEXTROSE 2-4 GM/100ML-% IV SOLN
2.0000 g | INTRAVENOUS | Status: AC
  Administered 2020-07-29: 2 g via INTRAVENOUS

## 2020-07-29 MED ORDER — DEXAMETHASONE SODIUM PHOSPHATE 10 MG/ML IJ SOLN
INTRAMUSCULAR | Status: DC | PRN
  Administered 2020-07-29: 5 mg via INTRAVENOUS

## 2020-07-29 MED ORDER — FENTANYL CITRATE (PF) 100 MCG/2ML IJ SOLN
25.0000 ug | INTRAMUSCULAR | Status: DC | PRN
  Administered 2020-07-29: 25 ug via INTRAVENOUS

## 2020-07-29 MED ORDER — ONDANSETRON HCL 4 MG/2ML IJ SOLN: 4.0000 mg | Freq: Once | INTRAMUSCULAR | Status: DC | PRN

## 2020-07-29 MED ORDER — CHLORHEXIDINE GLUCONATE 0.12 % MT SOLN
OROMUCOSAL | Status: AC
  Administered 2020-07-29: 15 mL via OROMUCOSAL
  Filled 2020-07-29: qty 15

## 2020-07-29 MED ORDER — HYDROCODONE-ACETAMINOPHEN 5-325 MG PO TABS: 1.0000 | ORAL_TABLET | Freq: Once | ORAL | Status: AC

## 2020-07-29 MED ORDER — LIDOCAINE HCL (PF) 2 % IJ SOLN
INTRAMUSCULAR | Status: AC
  Filled 2020-07-29: qty 5

## 2020-07-29 MED ORDER — DEXAMETHASONE SODIUM PHOSPHATE 10 MG/ML IJ SOLN
INTRAMUSCULAR | Status: AC
  Filled 2020-07-29: qty 1

## 2020-07-29 MED ORDER — HYDROCODONE-ACETAMINOPHEN 5-325 MG PO TABS: 1.0000 | ORAL_TABLET | Freq: Four times a day (QID) | ORAL | 0 refills | Status: DC | PRN

## 2020-07-29 MED ORDER — ONDANSETRON HCL 4 MG PO TABS: 4.0000 mg | ORAL_TABLET | Freq: Four times a day (QID) | ORAL | Status: DC | PRN

## 2020-07-29 MED ORDER — FENTANYL CITRATE (PF) 100 MCG/2ML IJ SOLN
INTRAMUSCULAR | Status: AC
  Filled 2020-07-29: qty 2

## 2020-07-29 MED ORDER — LACTATED RINGERS IV SOLN: INTRAVENOUS | Status: DC

## 2020-07-29 MED ORDER — METOCLOPRAMIDE HCL 10 MG PO TABS: 5.0000 mg | ORAL_TABLET | Freq: Three times a day (TID) | ORAL | Status: DC | PRN

## 2020-07-29 MED ORDER — PROPOFOL 10 MG/ML IV BOLUS
INTRAVENOUS | Status: AC
  Filled 2020-07-29: qty 20

## 2020-07-29 MED ORDER — FENTANYL CITRATE (PF) 100 MCG/2ML IJ SOLN
INTRAMUSCULAR | Status: AC
  Administered 2020-07-29: 25 ug via INTRAVENOUS
  Filled 2020-07-29: qty 2

## 2020-07-29 SURGICAL SUPPLY — 39 items
APL PRP STRL LF DISP 70% ISPRP (MISCELLANEOUS) ×1
BIT DRILL 1.1 (BIT) ×2
BIT DRILL 60X20X1.1XQC TMX (BIT) IMPLANT
BIT DRL 60X20X1.1XQC TMX (BIT) ×1
BNDG GAUZE 1X2.1 STRL (MISCELLANEOUS) ×2 IMPLANT
CAST PADDING 2X4YD ST 30245 (MISCELLANEOUS) ×1
CHLORAPREP W/TINT 26 (MISCELLANEOUS) ×2 IMPLANT
COVER WAND RF STERILE (DRAPES) ×2 IMPLANT
CUFF TOURN SGL QUICK 18X4 (TOURNIQUET CUFF) IMPLANT
DRAPE FLUOR MINI C-ARM 54X84 (DRAPES) ×2 IMPLANT
DRIVER BIT 1.5 (TRAUMA) ×1 IMPLANT
ELECT REM PT RETURN 9FT ADLT (ELECTROSURGICAL) ×2
ELECTRODE REM PT RTRN 9FT ADLT (ELECTROSURGICAL) ×1 IMPLANT
GAUZE SPONGE 4X4 12PLY STRL (GAUZE/BANDAGES/DRESSINGS) ×2 IMPLANT
GAUZE XEROFORM 1X8 LF (GAUZE/BANDAGES/DRESSINGS) ×2 IMPLANT
GLOVE SURG SYN 9.0  PF PI (GLOVE) ×2
GLOVE SURG SYN 9.0 PF PI (GLOVE) ×1 IMPLANT
GOWN SRG 2XL LVL 4 RGLN SLV (GOWNS) ×1 IMPLANT
GOWN STRL NON-REIN 2XL LVL4 (GOWNS) ×2
GOWN STRL REUS W/ TWL LRG LVL3 (GOWN DISPOSABLE) ×1 IMPLANT
GOWN STRL REUS W/TWL LRG LVL3 (GOWN DISPOSABLE) ×2
K-WIRE DBL TRONS .035X6 (WIRE) ×2
KIT TURNOVER KIT A (KITS) ×2 IMPLANT
KWIRE DBL TRONS .035X6 (WIRE) IMPLANT
LOCK SCREW 1.5X9MM (Screw) ×8 IMPLANT
NS IRRIG 500ML POUR BTL (IV SOLUTION) ×2 IMPLANT
PACK EXTREMITY (MISCELLANEOUS) ×2 IMPLANT
PAD PREP 24X41 OB/GYN DISP (PERSONAL CARE ITEMS) ×2 IMPLANT
PADDING CAST COTTON 2X4 ST (MISCELLANEOUS) ×1 IMPLANT
PLATE T CONT 1.5MM LOCKING (Plate) ×1 IMPLANT
SCALPEL PROTECTED #15 DISP (BLADE) ×4 IMPLANT
SCREW LOCK 1.5X9MM (Screw) IMPLANT
SCREW LOCKING 1.5X10 (Screw) ×3 IMPLANT
SCREW LOCKING 1.5X11MM (Screw) ×1 IMPLANT
SCREW NL 1.5X12 (Screw) ×1 IMPLANT
SCREW NONIOC 1.5 10M (Screw) ×1 IMPLANT
SPONGE GAUZE 2X2 8PLY STRL LF (GAUZE/BANDAGES/DRESSINGS) ×2 IMPLANT
SUT ETHIBOND 4-0 (SUTURE) ×2 IMPLANT
SUT ETHILON 5-0 FS-2 18 BLK (SUTURE) IMPLANT

## 2020-07-29 NOTE — Op Note (Signed)
07/29/2020  1:06 PM  PATIENT:  Debra Shelton  44 y.o. female  PRE-OPERATIVE DIAGNOSIS:  Closed displaced fracture of proximal phalanx of left ring finger, initial encounter S62.615A  POST-OPERATIVE DIAGNOSIS:  Closed displaced fracture of proximal phalanx of left ring finger, initial encounter S62.615A  PROCEDURE:  Procedure(s): OPEN REDUCTION INTERNAL FIXATION (ORIF) OF LEFT 4TH DIGIT  PROXIMAL PHALANX FRACTURE (Left)  SURGEON: Leitha Schuller, MD  ASSISTANTS: None  ANESTHESIA:   general  EBL:  Total I/O In: 600 [I.V.:500; IV Piggyback:100] Out: -   BLOOD ADMINISTERED:none  DRAINS: none   LOCAL MEDICATIONS USED:  MARCAINE     SPECIMEN:  No Specimen  DISPOSITION OF SPECIMEN:  N/A  COUNTS:  YES  TOURNIQUET: 67 minutes at room of mercury  IMPLANTS: Biomet hand Alps tray with small T plate and multiple locking screws  DICTATION: .Dragon Dictation patient was brought to the operating room and after adequate anesthesia was obtained the left arm was prepped and draped in the usual sterile fashion.  After patient identification and timeout procedures were completed tourniquet was raised to the proximal forearm.  Attempted closed reduction was unsuccessful even with a reduction clamp to assist so determination was made to open the fracture.  Curvilinear incision was made over the extensor hood of the MCP joint and the proximal phalanx of the affected finger.  The skin flap was elevated and the extensor tendon split with exposure of the proximal phalanx.  With the reduction clamp anatomic reduction was obtained there was some soft tissue interposed preventing reduction.  Next the small T plate was contoured to fit and proximal distal screws were placed initially pinning to try to get appropriate alignment.  After getting the right position for the plate and getting proximal distal screws additional locking screws were placed with the initial nonlocking screw switched out to get better  fixation.  With range of motion the fracture was stable and the rotatory deformity was corrected.  Mini C arm views were obtained that showed good alignment essentially anatomic the wound was irrigated and the extensor tendon repaired using a running 4-0 Vicryl followed by 5-0 nylon for the skin.  Xeroform 4 x 4's web roll dorsal splint with the MCP in flexion applied followed by an Ace wrap.  10 cc of half percent Sensorcaine were infiltrated as a digital block prior to closure to aid in postop analgesia  PLAN OF CARE: Discharge to home after PACU  PATIENT DISPOSITION:  PACU - hemodynamically stable.

## 2020-07-29 NOTE — Discharge Instructions (Addendum)
AMBULATORY SURGERY  DISCHARGE INSTRUCTIONS   1) The drugs that you were given will stay in your system until tomorrow so for the next 24 hours you should not:  A) Drive an automobile B) Make any legal decisions C) Drink any alcoholic beverage   2) You may resume regular meals tomorrow.  Today it is better to start with liquids and gradually work up to solid foods.  You may eat anything you prefer, but it is better to start with liquids, then soup and crackers, and gradually work up to solid foods.   3) Please notify your doctor immediately if you have any unusual bleeding, trouble breathing, redness and pain at the surgery site, drainage, fever, or pain not relieved by medication.    4) Additional Instructions:        Please contact your physician with any problems or Same Day Surgery at [phone], Monday through Friday 6 am to 4 pm, or [place] at [facility] number at [phone].Keep arm elevated is much as you can through the weekend.  Okay to work on finger motion as tolerated in brace. Pain medicine as directed Keep splint clean and dry until recheck.

## 2020-07-29 NOTE — H&P (Signed)
Chief Complaint  Patient presents with  . Closed displaced fracture of proximal phalanx of left ring f  Injuried finger while playing valleyball on 07/25/2020.   Debra Shelton is a 44 y.o. female who presents today for evaluation of a left fourth finger injury. 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 The patient felt immediate pain and thought she had dislocated her left fourth digit. When the pain continued she went to the walk-in clinic the next day, date of injury was on 07/25/2020. At the walk-in clinic x-rays of the left fourth digit demonstrated a oblique fracture through the fourth proximal phalanx with mild impaction and soft tissue swelling. A AlumaFoam splint was applied to the fourth and third digits and the patient was instructed to follow-up orthopedics. She presents today denying any numbness or tingling to the left upper extremity. She denies any surgical history to the left hand, she does report a history of 1/5 metacarpal fracture which did not require any surgical intervention. The patient reports a 1 out of 10 pain score to the left hand at today's visit. Patient denies any personal history of heart attack, stroke, asthma or COPD. No personal history of blood clots for the patient. The patient is right-hand dominant. She is very active.  Past Medical History: Past Medical History:  Diagnosis Date  . Pneumonia 04/2007  Admitted x 10 days   Past Surgical History: Past Surgical History:  Procedure Laterality Date  . AUGMENTATION MAMMOPLASTY BILATERAL W/PROSTHESIS Bilateral 2003  . INCISIONAL BIOPSY BREAST Left 2003  x two  . THORACOTOMY / DECORTICATION PARIETAL PLEURA Left 04/2007   Past Family History: Family History  Problem Relation Age of Onset  . Asthma Father   Medications: Current Outpatient Medications Ordered in Epic  Medication Sig Dispense Refill  . levonorgestreL (MIRENA 52 MG) 20  mcg/24 hr (6 years) IUD Insert 1 each into the uterus once Follow package directions.   No current Epic-ordered facility-administered medications on file.   Allergies: No Known Allergies   Review of Systems:  A comprehensive 14 point ROS was performed, reviewed by me today, and the pertinent orthopaedic findings are documented in the HPI.  Exam: BP 110/80  Ht 162.6 cm (5\' 4" )  Wt 68.8 kg (151 lb 9.6 oz)  BMI 26.02 kg/m  General/Constitutional: The patient appears to be well-nourished, well-developed, and in no acute distress. Neuro/Psych: Normal mood and affect, oriented to person, place and time. Eyes: Non-icteric. Pupils are equal, round, and reactive to light, and exhibit synchronous movement. ENT: Unremarkable. Lymphatic: No palpable adenopathy. Respiratory: Lungs clear to auscultation, Normal chest excursion, No wheezes and Non-labored breathing Cardiovascular: Regular rate and rhythm. No murmurs. and No edema, swelling or tenderness, except as noted in detailed exam. Integumentary: No impressive skin lesions present, except as noted in detailed exam. Musculoskeletal: Unremarkable, except as noted in detailed exam.  The patient was removed from the left hand splint. Skin examination demonstrates moderate ecchymosis along the dorsal and volar aspect of the fourth digit. No break of the skin is noted. Mild swelling still present throughout the left fourth digit. The patient is intact light touch about the left hand including over the radial and ulnar aspect of the fourth digit. Unable to obtain cap refill due to painted nails at today's visit. The patient is able to extend, increased pain with flexion of the fourth digit. The patient is tender palpation over the proximal phalanx of  left fourth digit. Rotational deformity to the fourth digit is noted, the fourth digit is going over the fifth digit with flexion. The fourth and third digits were buddy taped at today's visit with a volar  AlumaFoam splint was applied.  Imaging: X-rays obtained at the walk-in clinic are detailed in the HPI above.  Impression: Closed displaced fracture of proximal phalanx of left ring finger, initial encounter [S62.615A] Closed displaced fracture of proximal phalanx of left ring finger, initial encounter (primary encounter diagnosis)  Plan:  1. Treatment options were discussed today with the patient. The patient did not tolerate a volar hand splint being applied so a volar AlumaFoam splint was applied to the fingers. 2. Instructed the patient on the risk and benefits of surgical intervention for the left fourth digit. Instructed the patient that without surgical intervention she would continue to have a deformity and decreased grip strength. 3. The patient will be scheduled for a closed reduction and percutaneous pinning versus open reduction internal fixation of left fourth digit proximal phalanx fracture, surgery will be performed by Dr. Joice Lofts. 4. This document will serve as a surgical history and physical for the patient. 5. The patient will follow-up per standard post-op protocol. They can call the clinic they have any questions, new symptoms develop or symptoms worsen.  The procedure was discussed with the patient, as were the potential risks (including bleeding, infection, nerve and/or blood vessel injury, persistent or recurrent pain, failure of the repair, progression of arthritis, need for further surgery, blood clots, strokes, heart attacks and/or arhythmias, pneumonia, etc.) and benefits. The patient states her understanding and wishes to proceed.  This office visit took 45 minutes, of which >50% involved patient counseling/education.  Review of the [place] CSRS was performed in accordance of the [facility] prior to dispensing any controlled drugs.  This note was generated in part with voice recognition software and I apologize for any typographical errors that were not detected and  corrected.  Valeria Batman, PA-C [facility]   Electronically signed by Anson [place], PA at 07/28/2020 12:29 PM EDT  Back to top of Progress Notes Unk Lightning, CMA - 07/28/2020 10:30 AM EDT Formatting of this note might be different from the original. Review of Systems  Constitutional: Negative.  HENT: Negative.  Eyes: Negative.  Respiratory: Negative.  Cardiovascular: Negative.  Gastrointestinal: Negative.  Endocrine: Negative.  Genitourinary: Negative.  Musculoskeletal: Positive for arthralgias.  Allergic/Immunologic: Negative.  Neurological: Negative.  Hematological: Negative.  Psychiatric/Behavioral: Negative.    Electronically signed by Unk Lightning, CMA at 07/28/2020 12:29 PM EDT   Reviewed paper H+P, will be scanned into chart. No changes noted. Dr. Lucita Lora is unable to get this on the schedule today and has referred the patient to me for ORIF.

## 2020-07-29 NOTE — Transfer of Care (Signed)
Immediate Anesthesia Transfer of Care Note  Patient: Debra Shelton  Procedure(s) Performed: OPEN REDUCTION INTERNAL FIXATION (ORIF) OF LEFT 4TH DIGIT  PROXIMAL PHALANX FRACTURE (Left Finger)  Patient Location: PACU  Anesthesia Type:General  Level of Consciousness: awake  Airway & Oxygen Therapy: Patient Spontanous Breathing and Patient connected to nasal cannula oxygen  Post-op Assessment: Report given to RN  Post vital signs: stable  Last Vitals:  Vitals Value Taken Time  BP    Temp    Pulse 80 07/29/20 1312  Resp    SpO2 99 % 07/29/20 1312  Vitals shown include unvalidated device data.  Last Pain:  Vitals:   07/29/20 0917  TempSrc: Temporal  PainSc: 0-No pain         Complications: No complications documented.

## 2020-07-29 NOTE — Anesthesia Preprocedure Evaluation (Signed)
Anesthesia Evaluation  Patient identified by MRN, date of birth, ID band Patient awake    Reviewed: Allergy & Precautions, NPO status , Patient's Chart, lab work & pertinent test results  Airway Mallampati: II  TM Distance: >3 FB     Dental   Pulmonary pneumonia, resolved,    Pulmonary exam normal        Cardiovascular Normal cardiovascular exam     Neuro/Psych negative neurological ROS  negative psych ROS   GI/Hepatic   Endo/Other    Renal/GU      Musculoskeletal   Abdominal Normal abdominal exam  (+)   Peds  Hematology   Anesthesia Other Findings   Reproductive/Obstetrics                             Anesthesia Physical Anesthesia Plan  ASA: I  Anesthesia Plan: General   Post-op Pain Management:    Induction:   PONV Risk Score and Plan:   Airway Management Planned: LMA  Additional Equipment:   Intra-op Plan:   Post-operative Plan:   Informed Consent: I have reviewed the patients History and Physical, chart, labs and discussed the procedure including the risks, benefits and alternatives for the proposed anesthesia with the patient or authorized representative who has indicated his/her understanding and acceptance.     Dental advisory given  Plan Discussed with: CRNA and Surgeon  Anesthesia Plan Comments:         Anesthesia Quick Evaluation

## 2020-07-29 NOTE — Anesthesia Procedure Notes (Signed)
Procedure Name: LMA Insertion Date/Time: 07/29/2020 11:37 AM Performed by: Jaye Beagle, CRNA Pre-anesthesia Checklist: Patient identified, Emergency Drugs available, Suction available and Patient being monitored Patient Re-evaluated:Patient Re-evaluated prior to induction Oxygen Delivery Method: Circle system utilized Preoxygenation: Pre-oxygenation with 100% oxygen Induction Type: IV induction Ventilation: Mask ventilation without difficulty LMA: LMA inserted LMA Size: 4.0 Number of attempts: 1 Placement Confirmation: positive ETCO2 and breath sounds checked- equal and bilateral Tube secured with: Tape Dental Injury: Teeth and Oropharynx as per pre-operative assessment

## 2020-07-29 NOTE — Anesthesia Postprocedure Evaluation (Signed)
Anesthesia Post Note  Patient: Debra Shelton  Procedure(s) Performed: OPEN REDUCTION INTERNAL FIXATION (ORIF) OF LEFT 4TH DIGIT  PROXIMAL PHALANX FRACTURE (Left Finger)  Patient location during evaluation: PACU Anesthesia Type: General Level of consciousness: awake and alert and oriented Pain management: pain level controlled Vital Signs Assessment: post-procedure vital signs reviewed and stable Respiratory status: spontaneous breathing Cardiovascular status: blood pressure returned to baseline Anesthetic complications: no   No complications documented.   Last Vitals:  Vitals:   07/29/20 1410 07/29/20 1425  BP:  114/86  Pulse: 60 64  Resp: 10 16  Temp:    SpO2: (!) 89% 100%    Last Pain:  Vitals:   07/29/20 1425  TempSrc:   PainSc: 2                  [name]

## 2020-07-30 ENCOUNTER — Encounter: Payer: Self-pay | Admitting: Orthopedic Surgery

## 2020-08-16 ENCOUNTER — Other Ambulatory Visit: Payer: Self-pay

## 2020-08-16 ENCOUNTER — Ambulatory Visit: Payer: 59 | Attending: Orthopedic Surgery | Admitting: Occupational Therapy

## 2020-08-16 DIAGNOSIS — L905 Scar conditions and fibrosis of skin: Secondary | ICD-10-CM | POA: Insufficient documentation

## 2020-08-16 DIAGNOSIS — M6281 Muscle weakness (generalized): Secondary | ICD-10-CM | POA: Insufficient documentation

## 2020-08-16 DIAGNOSIS — R600 Localized edema: Secondary | ICD-10-CM | POA: Diagnosis present

## 2020-08-16 DIAGNOSIS — M25642 Stiffness of left hand, not elsewhere classified: Secondary | ICD-10-CM | POA: Insufficient documentation

## 2020-08-16 NOTE — Therapy (Deleted)
[facility] [address] [place], [address] Phone: [phone]   Fax:  [phone]  Occupational Therapy Treatment  Patient Details  Name: Debra Shelton MRN: [id number] Date of Birth: [birth year] Referring Provider (OT): Cranston Neighbor   Encounter Date: 08/16/2020   OT End of Session - 08/16/20 1403    Visit Number 1    Number of Visits 12    Date for OT Re-Evaluation 10/11/20    OT Start Time 1128    OT Stop Time 1208    OT Time Calculation (min) 40 min    Activity Tolerance Patient tolerated treatment well    Behavior During Therapy [facility] for tasks assessed/performed           Past Medical History:  Diagnosis Date  . Pneumonia     Past Surgical History:  Procedure Laterality Date  . AUGMENTATION MAMMAPLASTY    . CHEST TUBE INSERTION Left 05/2008  . OPEN REDUCTION INTERNAL FIXATION (ORIF) PROXIMAL PHALANX Left 07/29/2020   Procedure: OPEN REDUCTION INTERNAL FIXATION (ORIF) OF LEFT 4TH DIGIT  PROXIMAL PHALANX FRACTURE;  Surgeon: Kennedy Bucker, MD;  Location: [facility] ORS;  Service: Orthopedics;  Laterality: Left;    There were no vitals filed for this visit.   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 Yes    Pain Score 2     Pain Location Finger (Comment which one)    Pain Orientation Left    Pain Descriptors / Indicators Tightness;Tender    Pain Type Surgical pain    Pain Onset 1 to 4 weeks ago    Pain Frequency Intermittent     Aggravating Factors  bending 4th digit              [facility] OT Assessment - 08/16/20 0001      Assessment   Medical Diagnosis ORIF of 4th L prox phalanges    Referring Provider (OT) Cranston Neighbor    Onset Date/Surgical Date 07/29/20    Hand Dominance Right    Next MD Visit --   in 2 wks      Precautions   Precaution Comments --   2 1/2 wks out from ORIF     Home  Environment   Lives With Family      Prior Function   Vocation Full time employment    Leisure work as Airline pilot, Programmer, applications, work out with [employer], house work , 69 yr old son       Edema   Edema L 4th prox phal 6.2 cm, R 5.2 , PIP R 5.7, L 6.1 cm       Left Hand AROM   L Ring  MCP 0-90 90 Degrees    L Ring PIP 0-100 40 Degrees   -20   L Ring DIP 0-70 45 Degrees  OT Treatments/Exercises (OP) - 08/16/20 0001      LUE Fluidotherapy   Number Minutes Fluidotherapy 8 Minutes    LUE Fluidotherapy Location Hand    Comments AROM for digits prior to review of HEP                   OT Education - 08/16/20 1403    Education Details findings of eval and HEP    Person(s) Educated Patient    Methods Explanation;Demonstration;Verbal cues;Handout;Tactile cues    Comprehension Returned demonstration;Verbal cues required;Verbalized understanding            OT Short Term Goals - 08/16/20 1449      OT SHORT TERM GOAL #1   Title Pt to be independent in HEP to decrease scar tissue and increase AROM to 4th digit to grasp utencils , brush , deodorant    Baseline decrease AROM - cannot grip cylinder objects    Time 4    Period Weeks    Status New    Target Date 09/13/20             OT Long Term Goals - 08/16/20 1451      OT LONG TERM GOAL #1   Title L 4th digit PIP flexion , composite flexion to be able to touch palm to hold jewelry and money    Baseline decrease flexion at PIP and DIP - composite fis - cannot grasp or hold objects    Time 6    Period Weeks     Status New    Target Date 09/27/20      OT LONG TERM GOAL #2   Title L grip strength increase to more than 50% compare to R to carry groceries and more than 10 lbs without symptoms    Baseline 2 1/2 wks s/p    Time 8    Period Weeks    Status New    Target Date 10/11/20      OT LONG TERM GOAL #3   Title Function score on PRWHE improve to less than 5/50 to do weights, house work , Location manager    Baseline 2 1/2 wks s/p ORIF 4th proxim phalanges fx    Time 8    Period Weeks    Status New    Target Date 10/11/20                 Plan - 08/16/20 1445    Clinical Impression Statement Pt is 2 1/2 wks s/p L 4th proximal phalanges fx with ORIF - pt present with increase scar tissue - hyper sensitivity  and scar adhesion limiting her extention of 4th digit and flexion of PIP, DIP and composite flexion - some ulnar deviation of 4th - increase edema - all limiting her functional use of L hand in ADLl's and IADL's    OT Occupational Profile and History Problem Focused Assessment - Including review of records relating to presenting problem    Occupational performance deficits (Please refer to evaluation for details): ADL's;IADL's;Work;Play;Leisure;Social Participation    Body Structure / Function / Physical Skills ADL;Edema;Dexterity;Decreased knowledge of precautions;Flexibility;ROM;UE functional use;Scar mobility;IADL;Pain;Strength    Rehab Potential Good    Clinical Decision Making Limited treatment options, no task modification necessary    Comorbidities Affecting Occupational Performance: None    Modification or Assistance to Complete Evaluation  No modification of tasks or assist necessary to complete eval    OT Frequency 2x / week   2 wk for 4  wks , 1 x wk for 4 wks   OT Duration 8 weeks    OT Treatment/Interventions Self-care/ADL training;Moist Heat;Paraffin;Fluidtherapy;Contrast Bath;Manual Therapy;Passive range of motion;Scar mobilization;Splinting;Patient/family  education;Therapeutic exercise    Plan assess progress and upgrade as needed    OT Home Exercise Plan see pt instruction    Consulted and Agree with Plan of Care Patient           Patient will benefit from skilled therapeutic intervention in order to improve the following deficits and impairments:   Body Structure / Function / Physical Skills: ADL, Edema, Dexterity, Decreased knowledge of precautions, Flexibility, ROM, UE functional use, Scar mobility, IADL, Pain, Strength       Visit Diagnosis: Stiffness of left hand, not elsewhere classified - Plan: Ot plan of care cert/re-cert  Scar condition and fibrosis of skin - Plan: Ot plan of care cert/re-cert  Muscle weakness (generalized) - Plan: Ot plan of care cert/re-cert  Localized edema - Plan: Ot plan of care cert/re-cert    Problem List There are no problems to display for this patient.   Oletta Cohn 08/16/2020, 2:57 PM  [facility] [address], [place], [address] Phone: [phone]   Fax:  [phone]  Name: Debra Shelton MRN: [id number] Date of Birth: October 18, 1976

## 2020-08-16 NOTE — Patient Instructions (Signed)
Heat or contrast 3 x day Scar massage and desentitization - massage and roughter textures   Blocked AROM for DIP and PIP flexion  Tendon glides - AROM  10reps Keep wrist straight  And fisting with and without buddy strap to 3 cm cylinder and 4 cm foam block   Wear silicon sleeve night time and during day when typing  Buddy strap on and off 2 hrs during day when using hand - not on computer

## 2020-08-16 NOTE — Therapy (Signed)
[facility] [address] [place], [address] Phone: [phone]   Fax:  [phone]  Occupational Therapy Evaluation  Patient Details  Name: Debra Shelton MRN: [id number] Date of Birth: 09/04/1976 Referring Provider (OT): Cranston Neighbor   Encounter Date: 08/16/2020   OT End of Session - 08/16/20 1403    Visit Number 1    Number of Visits 12    Date for OT Re-Evaluation 10/11/20    OT Start Time 1128    OT Stop Time 1208    OT Time Calculation (min) 40 min    Activity Tolerance Patient tolerated treatment well    Behavior During Therapy [facility] for tasks assessed/performed           Past Medical History:  Diagnosis Date  . Pneumonia     Past Surgical History:  Procedure Laterality Date  . AUGMENTATION MAMMAPLASTY    . CHEST TUBE INSERTION Left 05/2008  . OPEN REDUCTION INTERNAL FIXATION (ORIF) PROXIMAL PHALANX Left 07/29/2020   Procedure: OPEN REDUCTION INTERNAL FIXATION (ORIF) OF LEFT 4TH DIGIT  PROXIMAL PHALANX FRACTURE;  Surgeon: Kennedy Bucker, MD;  Location: [facility] ORS;  Service: Orthopedics;  Laterality: Left;    There were no vitals filed for this visit.   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 Yes    Pain Score 2     Pain Location Finger (Comment which one)    Pain Orientation Left    Pain Descriptors / Indicators Tightness;Tender    Pain Type Surgical pain    Pain Onset 1 to 4 weeks ago    Pain Frequency Intermittent     Aggravating Factors  bending 4th digit             [facility] OT Assessment - 08/16/20 0001      Assessment   Medical Diagnosis ORIF of 4th L prox phalanges    Referring Provider (OT) Cranston Neighbor    Onset Date/Surgical Date 07/29/20    Hand Dominance Right    Next MD Visit --   in 2 wks      Precautions   Precaution Comments --   2 1/2 wks out from ORIF     Home  Environment   Lives With Family      Prior Function   Vocation Full time employment    Leisure work as Airline pilot, Programmer, applications, work out with [employer], house work , 45 yr old son       Edema   Edema L 4th prox phal 6.2 cm, R 5.2 , PIP R 5.7, L 6.1 cm       Left Hand AROM   L Ring  MCP 0-90 90 Degrees    L Ring PIP 0-100 40 Degrees   -20   L Ring DIP 0-70 45 Degrees  OT Treatments/Exercises (OP) - 08/16/20 0001      LUE Fluidotherapy   Number Minutes Fluidotherapy 8 Minutes    LUE Fluidotherapy Location Hand    Comments AROM for digits prior to review of HEP            Heat or contrast 3 x day Scar massage and desentitization - massage and roughter textures   Blocked AROM for DIP and PIP flexion  Tendon glides - AROM  10reps Keep wrist straight  And fisting with and without buddy strap to 3 cm cylinder and 4 cm foam block   Wear silicon sleeve night time and during day when typing  Buddy strap on and off 2 hrs during day when using hand - not on computer         OT Education - 08/16/20 1403    Education Details findings of eval and HEP    Person(s) Educated Patient    Methods Explanation;Demonstration;Verbal cues;Handout;Tactile cues    Comprehension Returned demonstration;Verbal cues required;Verbalized understanding            OT Short Term Goals - 08/16/20 1449      OT SHORT TERM GOAL #1   Title Pt to be independent in HEP to decrease scar tissue and increase AROM to 4th digit to grasp utencils , brush , deodorant    Baseline decrease AROM -  cannot grip cylinder objects    Time 4    Period Weeks    Status New    Target Date 09/13/20             OT Long Term Goals - 08/16/20 1451      OT LONG TERM GOAL #1   Title L 4th digit PIP flexion , composite flexion to be able to touch palm to hold jewelry and money    Baseline decrease flexion at PIP and DIP - composite fis - cannot grasp or hold objects    Time 6    Period Weeks    Status New    Target Date 09/27/20      OT LONG TERM GOAL #2   Title L grip strength increase to more than 50% compare to R to carry groceries and more than 10 lbs without symptoms    Baseline 2 1/2 wks s/p    Time 8    Period Weeks    Status New    Target Date 10/11/20      OT LONG TERM GOAL #3   Title Function score on PRWHE improve to less than 5/50 to do weights, house work , Location manager    Baseline 2 1/2 wks s/p ORIF 4th proxim phalanges fx    Time 8    Period Weeks    Status New    Target Date 10/11/20                 Plan - 08/16/20 1445    Clinical Impression Statement Pt is 2 1/2 wks s/p L 4th proximal phalanges fx with ORIF - pt present with increase scar tissue - hyper sensitivity  and scar adhesion limiting her extention of 4th digit and flexion of PIP, DIP and composite flexion - some ulnar deviation of 4th - increase edema - all limiting her functional use of L hand in ADLl's and IADL's    OT Occupational Profile and History Problem Focused Assessment - Including review of records relating to presenting problem    Occupational performance deficits (Please refer to evaluation for  details): ADL's;IADL's;Work;Play;Leisure;Social Participation    Body Structure / Function / Physical Skills ADL;Edema;Dexterity;Decreased knowledge of precautions;Flexibility;ROM;UE functional use;Scar mobility;IADL;Pain;Strength    Rehab Potential Good    Clinical Decision Making Limited treatment options, no task modification necessary    Comorbidities Affecting Occupational  Performance: None    Modification or Assistance to Complete Evaluation  No modification of tasks or assist necessary to complete eval    OT Frequency 2x / week   2 wk for 4 wks , 1 x wk for 4 wks   OT Duration 8 weeks    OT Treatment/Interventions Self-care/ADL training;Moist Heat;Paraffin;Fluidtherapy;Contrast Bath;Manual Therapy;Passive range of motion;Scar mobilization;Splinting;Patient/family education;Therapeutic exercise    Plan assess progress and upgrade as needed    OT Home Exercise Plan see pt instruction    Consulted and Agree with Plan of Care Patient           Patient will benefit from skilled therapeutic intervention in order to improve the following deficits and impairments:   Body Structure / Function / Physical Skills: ADL, Edema, Dexterity, Decreased knowledge of precautions, Flexibility, ROM, UE functional use, Scar mobility, IADL, Pain, Strength       Visit Diagnosis: Stiffness of left hand, not elsewhere classified - Plan: Ot plan of care cert/re-cert  Scar condition and fibrosis of skin - Plan: Ot plan of care cert/re-cert  Muscle weakness (generalized) - Plan: Ot plan of care cert/re-cert  Localized edema - Plan: Ot plan of care cert/re-cert    Problem List There are no problems to display for this patient.   Oletta Cohn OTR/l,CLT 08/16/2020, 2:58 PM  [facility] PHYSICAL AND SPORTS MEDICINE [address], [place], [address] Phone: [phone]   Fax:  [phone]  Name: BRANDIS WIXTED MRN: [id number] Date of Birth: [birth year]

## 2020-08-20 ENCOUNTER — Ambulatory Visit: Payer: 59 | Attending: Orthopedic Surgery | Admitting: Occupational Therapy

## 2020-08-20 ENCOUNTER — Other Ambulatory Visit: Payer: Self-pay

## 2020-08-20 DIAGNOSIS — R600 Localized edema: Secondary | ICD-10-CM | POA: Insufficient documentation

## 2020-08-20 DIAGNOSIS — M25642 Stiffness of left hand, not elsewhere classified: Secondary | ICD-10-CM | POA: Diagnosis present

## 2020-08-20 DIAGNOSIS — M6281 Muscle weakness (generalized): Secondary | ICD-10-CM | POA: Insufficient documentation

## 2020-08-20 DIAGNOSIS — L905 Scar conditions and fibrosis of skin: Secondary | ICD-10-CM | POA: Diagnosis present

## 2020-08-20 NOTE — Therapy (Signed)
[facility] [address] [place], [address] Phone: [phone]   Fax:  [phone]  Occupational Therapy Treatment  Patient Details  Name: Debra Shelton MRN: [id number] Date of Birth: [birth year] Referring Provider (OT): Cranston Neighbor   Encounter Date: 08/20/2020   OT End of Session - 08/20/20 0933    Visit Number 2    Number of Visits 12    Date for OT Re-Evaluation 10/11/20    OT Start Time 0840    OT Stop Time 0923    OT Time Calculation (min) 43 min    Activity Tolerance Patient tolerated treatment well    Behavior During Therapy [facility] for tasks assessed/performed           Past Medical History:  Diagnosis Date   Pneumonia     Past Surgical History:  Procedure Laterality Date   AUGMENTATION MAMMAPLASTY     CHEST TUBE INSERTION Left 05/2008   OPEN REDUCTION INTERNAL FIXATION (ORIF) PROXIMAL PHALANX Left 07/29/2020   Procedure: OPEN REDUCTION INTERNAL FIXATION (ORIF) OF LEFT 4TH DIGIT  PROXIMAL PHALANX FRACTURE;  Surgeon: Kennedy Bucker, MD;  Location: [facility] ORS;  Service: Orthopedics;  Laterality: Left;    There were no vitals filed for this visit.   Subjective Assessment - 08/20/20 0850    Subjective  Doing okay with ther exercises - I know the tip is better- but the middle joint feels about the same -and every morning it is stiff    Pertinent History Pt had fx of L 4th digit prox phalanges on 07/25/2020- ended up with ORIF on 07/29/2020 - was in buddystrap and refer to OT for ROM    Currently in Pain? No/denies              [facility] OT Assessment - 08/20/20 0001      Left Hand AROM   L Ring  MCP 0-90 95 Degrees    L Ring PIP 0-100 50 Degrees   -15   L Ring DIP 0-70 55 Degrees           progress in AROM at PIP and DIP 10 degrees each  Cont to have scar adhesion over MC at proximal end of scar -          OT Treatments/Exercises (OP) - 08/20/20 0001      LUE Fluidotherapy    Number Minutes Fluidotherapy 8 Minutes    LUE Fluidotherapy Location Hand    Comments AROM for digits             Scar massage done by OT - using extractor 3-4 x , manual by OT and mini massager - tolerate on distal but tender at proximal scar  this date done and add to HEP kinesiotape in star over scar adhesion - 100% pull - to wear during day for scar mobilization -  Ed pt how to apply and let it breath at night  Cont with  desentitization - massage and roughter textures - doing better   PROM to DIP of 4th  Blocked AROM for DIP and PIP flexion  Tendon glides -composote flexion to 2 cm foam block   add and Review with pt to keep pen on palm at [facility] to block [facility] not to over flex more than 90  To put force at PIP  10reps Composite to narrow blue foam with and then without buddy strap Was able to get 60 degrees of flexion at PIP  Keep  wrist straight   Wear silicon sleeve night time and during day when typing  Buddy strap on and off 2 hrs during day when using hand - not on computer         OT Education - 08/20/20 0933    Education Details progress and review HEP    Person(s) Educated Patient    Methods Explanation;Demonstration;Verbal cues;Handout;Tactile cues    Comprehension Returned demonstration;Verbal cues required;Verbalized understanding            OT Short Term Goals - 08/16/20 1449      OT SHORT TERM GOAL #1   Title Pt to be independent in HEP to decrease scar tissue and increase AROM to 4th digit to grasp utencils , brush , deodorant    Baseline decrease AROM - cannot grip cylinder objects    Time 4    Period Weeks    Status New    Target Date 09/13/20             OT Long Term Goals - 08/16/20 1451      OT LONG TERM GOAL #1   Title L 4th digit PIP flexion , composite flexion to be able to touch palm to hold jewelry and money    Baseline decrease flexion at PIP and DIP - composite fis - cannot grasp or hold objects    Time 6    Period Weeks     Status New    Target Date 09/27/20      OT LONG TERM GOAL #2   Title L grip strength increase to more than 50% compare to R to carry groceries and more than 10 lbs without symptoms    Baseline 2 1/2 wks s/p    Time 8    Period Weeks    Status New    Target Date 10/11/20      OT LONG TERM GOAL #3   Title Function score on PRWHE improve to less than 5/50 to do weights, house work , Location manager    Baseline 2 1/2 wks s/p ORIF 4th proxim phalanges fx    Time 8    Period Weeks    Status New    Target Date 10/11/20                 Plan - 08/20/20 0934    Clinical Impression Statement Pt is 3 wks s/p L 4th proximal phalange fx with ORIF - show increase of 10 degrees at DIP flexion , 10 degrees at PIP - in session was able to get 60 degrees - pt to cont with AROM, scar mobs and functional use    OT Occupational Profile and History Problem Focused Assessment - Including review of records relating to presenting problem    Occupational performance deficits (Please refer to evaluation for details): ADL's;IADL's;Work;Play;Leisure;Social Participation    Body Structure / Function / Physical Skills ADL;Edema;Dexterity;Decreased knowledge of precautions;Flexibility;ROM;UE functional use;Scar mobility;IADL;Pain;Strength    Rehab Potential Good    Clinical Decision Making Limited treatment options, no task modification necessary    Comorbidities Affecting Occupational Performance: None    Modification or Assistance to Complete Evaluation  No modification of tasks or assist necessary to complete eval    OT Frequency 2x / week    OT Duration 8 weeks    OT Treatment/Interventions Self-care/ADL training;Moist Heat;Paraffin;Fluidtherapy;Contrast Bath;Manual Therapy;Passive range of motion;Scar mobilization;Splinting;Patient/family education;Therapeutic exercise    Plan assess progress and upgrade as needed    OT Home Exercise Plan see pt  instruction    Consulted and Agree with Plan of Care  Patient           Patient will benefit from skilled therapeutic intervention in order to improve the following deficits and impairments:   Body Structure / Function / Physical Skills: ADL, Edema, Dexterity, Decreased knowledge of precautions, Flexibility, ROM, UE functional use, Scar mobility, IADL, Pain, Strength       Visit Diagnosis: Stiffness of left hand, not elsewhere classified  Scar condition and fibrosis of skin  Muscle weakness (generalized)  Localized edema    Problem List There are no problems to display for this patient.   Oletta Cohn OTR/L,CLT 08/20/2020, 10:30 AM  [facility] PHYSICAL AND SPORTS MEDICINE [address] [place], [address] Phone: [phone]   Fax:  [phone]  Name: Debra Shelton MRN: [id number] Date of Birth: [birth year]

## 2020-08-26 ENCOUNTER — Ambulatory Visit: Payer: 59 | Admitting: Occupational Therapy

## 2020-08-26 ENCOUNTER — Other Ambulatory Visit: Payer: Self-pay

## 2020-08-26 DIAGNOSIS — M25642 Stiffness of left hand, not elsewhere classified: Secondary | ICD-10-CM | POA: Diagnosis not present

## 2020-08-26 DIAGNOSIS — L905 Scar conditions and fibrosis of skin: Secondary | ICD-10-CM

## 2020-08-26 DIAGNOSIS — M6281 Muscle weakness (generalized): Secondary | ICD-10-CM

## 2020-08-26 DIAGNOSIS — R600 Localized edema: Secondary | ICD-10-CM

## 2020-08-26 NOTE — Therapy (Signed)
[place] [facility] MEDICINE [address], [place], [address] Phone: [phone]   Fax:  [phone]  Occupational Therapy Treatment  Patient Details  Name: Debra Shelton MRN: [id number] Date of Birth: [birth year] Referring Provider (OT): Cranston Neighbor   Encounter Date: 08/26/2020   OT End of Session - 08/26/20 1053    Visit Number 3    Number of Visits 12    Date for OT Re-Evaluation 10/11/20    OT Start Time 1022    OT Stop Time 1045    OT Time Calculation (min) 23 min    Activity Tolerance Patient tolerated treatment well    Behavior During Therapy [facility] for tasks assessed/performed           Past Medical History:  Diagnosis Date  . Pneumonia     Past Surgical History:  Procedure Laterality Date  . AUGMENTATION MAMMAPLASTY    . CHEST TUBE INSERTION Left 05/2008  . OPEN REDUCTION INTERNAL FIXATION (ORIF) PROXIMAL PHALANX Left 07/29/2020   Procedure: OPEN REDUCTION INTERNAL FIXATION (ORIF) OF LEFT 4TH DIGIT  PROXIMAL PHALANX FRACTURE;  Surgeon: Kennedy Bucker, MD;  Location: [facility] ORS;  Service: Orthopedics;  Laterality: Left;    There were no vitals filed for this visit.   Subjective Assessment - 08/26/20 1051    Subjective  That taped you gave me to do - really helped for loosening up the scar - swelling better    Pertinent History Pt had fx of L 4th digit prox phalanges on 07/25/2020- ended up with ORIF on 07/29/2020 - was in buddystrap and refer to OT for ROM    Patient Stated Goals Want to be able to  use my hand like before - make fist and be able to grip , hold objects, play volleyball , lift weights, housewrok    Currently in Pain? Yes    Pain Score 2     Pain Location Finger (Comment which one)    Pain Orientation Left    Pain Descriptors / Indicators Tightness;Stabbing;Tender    Pain Type Surgical pain    Pain Onset 1 to 4 weeks ago    Pain Frequency Intermittent    Aggravating Factors  flexion of 4th                [facility] OT Assessment - 08/26/20 0001      Left Hand AROM   L Ring  MCP 0-90 95 Degrees    L Ring PIP 0-100 60 Degrees   65 in clinic ; -15 extention   L Ring DIP 0-70 60 Degrees           measurements taken - see flowsheet       adjusted few time hand over foam block -for light stretch into flexion -with MC blocked at 90 and C curve for PIP and DIP    OT Treatments/Exercises (OP) - 08/26/20 0001      Moist Heat Therapy   Number Minutes Moist Heat 6 Minutes    Moist Heat Location Hand   digits in slight flexion over foam block          Scar massage done by OT - using extractor 3-4 x , able to do easier today,  manual by OT and mini massager - tolerate better this date  - review and provided for  HEP kinesiotape to do  in star over scar adhesion - 100% pull - to wear during day for scar mobilization -  Ed pt how to apply and let it breath at night  Cont with  desentitization - massage and roughter textures - doing better   PROM to DIP of 4th  Blocked AROM for DIP and PIP flexion  Tendon glides -composote flexion to 2 cm foam block   add and Review again pt to keep pen in palm at [facility] to block [facility] not to over flex more than 90  To put force at PIP  10reps Composite to narrow blue foam with and then without buddy strap Was able to get 65-67 degrees of flexion at PIP  Keep wrist straight   Wear silicon sleeve night time and during day when typing  Buddy strap on and off 2 hrs during day when using hand - not on computer        OT Education - 08/26/20 1053    Education Details progress and review HEP    Person(s) Educated Patient    Methods Explanation;Demonstration;Verbal cues;Handout;Tactile cues    Comprehension Returned demonstration;Verbal cues required;Verbalized understanding            OT Short Term Goals - 08/16/20 1449      OT SHORT TERM GOAL #1   Title Pt to be independent in HEP to decrease scar tissue and increase AROM to 4th digit  to grasp utencils , brush , deodorant    Baseline decrease AROM - cannot grip cylinder objects    Time 4    Period Weeks    Status New    Target Date 09/13/20             OT Long Term Goals - 08/16/20 1451      OT LONG TERM GOAL #1   Title L 4th digit PIP flexion , composite flexion to be able to touch palm to hold jewelry and money    Baseline decrease flexion at PIP and DIP - composite fis - cannot grasp or hold objects    Time 6    Period Weeks    Status New    Target Date 09/27/20      OT LONG TERM GOAL #2   Title L grip strength increase to more than 50% compare to R to carry groceries and more than 10 lbs without symptoms    Baseline 2 1/2 wks s/p    Time 8    Period Weeks    Status New    Target Date 10/11/20      OT LONG TERM GOAL #3   Title Function score on PRWHE improve to less than 5/50 to do weights, house work , Location manager    Baseline 2 1/2 wks s/p ORIF 4th proxim phalanges fx    Time 8    Period Weeks    Status New    Target Date 10/11/20                 Plan - 08/26/20 1054    Clinical Impression Statement Pt is 4 wks s/p L 4th proximal phalangees fx with ORIF - cont to show increase AROM in 4th digit DIP and PIP - scar adhesion improved this date and edema- cont to only do AROM - await xray and will start PROM and stretches at 5-6 wks s/p    OT Occupational Profile and History Problem Focused Assessment - Including review of records relating to presenting problem    Occupational performance deficits (Please refer to evaluation for details): ADL's;IADL's;Work;Play;Leisure;Social Participation    Body Structure / Function /  Physical Skills ADL;Edema;Dexterity;Decreased knowledge of precautions;Flexibility;ROM;UE functional use;Scar mobility;IADL;Pain;Strength    Rehab Potential Good    Clinical Decision Making Limited treatment options, no task modification necessary    Comorbidities Affecting Occupational Performance: None    Modification  or Assistance to Complete Evaluation  No modification of tasks or assist necessary to complete eval    OT Frequency 2x / week    OT Duration 8 weeks    OT Treatment/Interventions Self-care/ADL training;Moist Heat;Paraffin;Fluidtherapy;Contrast Bath;Manual Therapy;Passive range of motion;Scar mobilization;Splinting;Patient/family education;Therapeutic exercise    Plan assess progress and upgrade as needed    OT Home Exercise Plan see pt instruction    Consulted and Agree with Plan of Care Patient           Patient will benefit from skilled therapeutic intervention in order to improve the following deficits and impairments:   Body Structure / Function / Physical Skills: ADL, Edema, Dexterity, Decreased knowledge of precautions, Flexibility, ROM, UE functional use, Scar mobility, IADL, Pain, Strength       Visit Diagnosis: Stiffness of left hand, not elsewhere classified  Scar condition and fibrosis of skin  Muscle weakness (generalized)  Localized edema    Problem List There are no problems to display for this patient.   Oletta Cohn  OTR/l,CLT 08/26/2020, 10:58 AM  [facility] PHYSICAL AND SPORTS MEDICINE [address], [place], [address] Phone: [phone]   Fax:  [phone]  Name: LYNDIE VANDERLOOP MRN: [id number] Date of Birth: 1976/12/14

## 2020-08-31 ENCOUNTER — Other Ambulatory Visit: Payer: Self-pay

## 2020-08-31 ENCOUNTER — Ambulatory Visit: Payer: 59 | Admitting: Occupational Therapy

## 2020-08-31 DIAGNOSIS — M25642 Stiffness of left hand, not elsewhere classified: Secondary | ICD-10-CM | POA: Diagnosis not present

## 2020-08-31 DIAGNOSIS — M6281 Muscle weakness (generalized): Secondary | ICD-10-CM

## 2020-08-31 DIAGNOSIS — R600 Localized edema: Secondary | ICD-10-CM

## 2020-08-31 DIAGNOSIS — L905 Scar conditions and fibrosis of skin: Secondary | ICD-10-CM

## 2020-08-31 NOTE — Therapy (Signed)
[facility] PH[facility]ICINE [address] [place], [address] Phone: [phone]   Fax:  [phone]  Occupational Therapy Treatment  Patient Details  Name: Debra Shelton MRN: [id number] Date of Birth: [birth year] Referring Provider (OT): Cranston Neighbor   Encounter Date: 08/31/2020   OT End of Session - 08/31/20 1519    Visit Number 4    Number of Visits 12    Date for OT Re-Evaluation 10/11/20    OT Start Time 1305    OT Stop Time 1347    OT Time Calculation (min) 42 min    Activity Tolerance Patient tolerated treatment well    Behavior During Therapy [facility] for tasks assessed/performed           Past Medical History:  Diagnosis Date  . Pneumonia     Past Surgical History:  Procedure Laterality Date  . AUGMENTATION MAMMAPLASTY    . CHEST TUBE INSERTION Left 05/2008  . OPEN REDUCTION INTERNAL FIXATION (ORIF) PROXIMAL PHALANX Left 07/29/2020   Procedure: OPEN REDUCTION INTERNAL FIXATION (ORIF) OF LEFT 4TH DIGIT  PROXIMAL PHALANX FRACTURE;  Surgeon: Kennedy Bucker, MD;  Location: [facility] ORS;  Service: Orthopedics;  Laterality: Left;    There were no vitals filed for this visit.   Subjective Assessment - 08/31/20 1514    Subjective  Seen Dr last Friday - feels about the same -that one area of the scar do not want to loosen up    Pertinent History Pt had fx of L 4th digit prox phalanges on 07/25/2020- ended up with ORIF on 07/29/2020 - was in buddystrap and refer to OT for ROM    Patient Stated Goals Want to be able to  use my hand like before - make fist and be able to grip , hold objects, play volleyball , lift weights, housewrok    Currently in Pain? No/denies              [facility] OT Assessment - 08/31/20 0001      Left Hand AROM   L Ring  MCP 0-90 95 Degrees    L Ring PIP 0-100 64 Degrees   70 in session ; and -15 ext   L Ring DIP 0-70 60 Degrees           AROM taken - see flow sheet  edema improving           OT Treatments/Exercises (OP) - 08/31/20 0001      RUE Paraffin   Number Minutes Paraffin 8 Minutes    RUE Paraffin Location Hand    Comments prior to soft tissue and scar massage              Scar massagedone by OT - using extractor 3-4 x , able to do easier today - attempt to do with MC in flexion  manual by OT and mini massager - tolerate better this date  - review and provided for  HEP more kinesiotape to do  in star over scar adhesion   - 100% pull - to wear during day for scar mobilization -  Ed pt how to apply and let it breath at night    PROM to DIP of 4th10 reps Blocked AROM for DIP and PIP flexion  Gentle AAROM done and kept 3 x 1 min to PIP flexion with MC at 90  With extention inbetween and tapping of extention of digits Tendon glides -composote flexion to 2 cm foam block  add and Review again pt to keep pen in palm at [facility] to block [facility] not to over flex more than 90  To put force at PIP 10reps Composite to narrow blue foam with and then without buddy strap- pt to alternate buddy strap to 5th - to decrease crossing over of 5th  Was able to get 70 degrees of flexion at PIPin session Keep wrist straight   Wear silicon sleeve night time and during day when typing           OT Education - 08/31/20 1519    Education Details progress and review HEP    Person(s) Educated Patient    Methods Explanation;Demonstration;Verbal cues;Handout;Tactile cues    Comprehension Returned demonstration;Verbal cues required;Verbalized understanding            OT Short Term Goals - 08/16/20 1449      OT SHORT TERM GOAL #1   Title Pt to be independent in HEP to decrease scar tissue and increase AROM to 4th digit to grasp utencils , brush , deodorant    Baseline decrease AROM - cannot grip cylinder objects    Time 4    Period Weeks    Status New    Target Date 09/13/20             OT Long Term Goals - 08/16/20 1451      OT LONG TERM GOAL #1    Title L 4th digit PIP flexion , composite flexion to be able to touch palm to hold jewelry and money    Baseline decrease flexion at PIP and DIP - composite fis - cannot grasp or hold objects    Time 6    Period Weeks    Status New    Target Date 09/27/20      OT LONG TERM GOAL #2   Title L grip strength increase to more than 50% compare to R to carry groceries and more than 10 lbs without symptoms    Baseline 2 1/2 wks s/p    Time 8    Period Weeks    Status New    Target Date 10/11/20      OT LONG TERM GOAL #3   Title Function score on PRWHE improve to less than 5/50 to do weights, house work , Location manager    Baseline 2 1/2 wks s/p ORIF 4th proxim phalanges fx    Time 8    Period Weeks    Status New    Target Date 10/11/20                 Plan - 08/31/20 1520    Clinical Impression Statement Pt is 5 wks s/p L 4th proximal phalanges fx with ORIF - pt cont to be limited by scar tissue adhesion over dorsal MC. Tolerating AROM well - per MD fx healing well and hardwear in place - cont with gentle AROM and PROM - but weight 2-3 wks until next xray for starting resistance    OT Occupational Profile and History Problem Focused Assessment - Including review of records relating to presenting problem    Occupational performance deficits (Please refer to evaluation for details): ADL's;IADL's;Work;Play;Leisure;Social Participation    Body Structure / Function / Physical Skills ADL;Edema;Dexterity;Decreased knowledge of precautions;Flexibility;ROM;UE functional use;Scar mobility;IADL;Pain;Strength    Rehab Potential Good    Clinical Decision Making Limited treatment options, no task modification necessary    Comorbidities Affecting Occupational Performance: None    Modification or Assistance to Complete  Evaluation  No modification of tasks or assist necessary to complete eval    OT Frequency 2x / week    OT Duration 8 weeks    OT Treatment/Interventions Self-care/ADL  training;Moist Heat;Paraffin;Fluidtherapy;Contrast Bath;Manual Therapy;Passive range of motion;Scar mobilization;Splinting;Patient/family education;Therapeutic exercise    Plan assess progress and upgrade as needed    OT Home Exercise Plan see pt instruction    Consulted and Agree with Plan of Care Patient           Patient will benefit from skilled therapeutic intervention in order to improve the following deficits and impairments:   Body Structure / Function / Physical Skills: ADL, Edema, Dexterity, Decreased knowledge of precautions, Flexibility, ROM, UE functional use, Scar mobility, IADL, Pain, Strength       Visit Diagnosis: Stiffness of left hand, not elsewhere classified  Scar condition and fibrosis of skin  Muscle weakness (generalized)  Localized edema    Problem List There are no problems to display for this patient.   Oletta Cohn OTR/l,CLT 08/31/2020, 3:24 PM  [facility] PHYSICAL AND SPORTS MEDICINE [address], [place], [address] Phone: [phone]   Fax:  [phone]  Name: Debra Shelton MRN: [id number] Date of Birth: 19-Oct-1976

## 2020-09-06 ENCOUNTER — Ambulatory Visit: Payer: 59 | Admitting: Occupational Therapy

## 2020-09-06 ENCOUNTER — Other Ambulatory Visit: Payer: Self-pay

## 2020-09-06 DIAGNOSIS — R600 Localized edema: Secondary | ICD-10-CM

## 2020-09-06 DIAGNOSIS — M25642 Stiffness of left hand, not elsewhere classified: Secondary | ICD-10-CM | POA: Diagnosis not present

## 2020-09-06 DIAGNOSIS — M6281 Muscle weakness (generalized): Secondary | ICD-10-CM

## 2020-09-06 DIAGNOSIS — L905 Scar conditions and fibrosis of skin: Secondary | ICD-10-CM

## 2020-09-06 NOTE — Therapy (Signed)
[facility] PH[facility]ICINE [address] [place], [address] Phone: [phone]   Fax:  [phone]  Occupational Therapy Treatment  Patient Details  Name: Debra Shelton MRN: [id number] Date of Birth: [birth year] Referring Provider (OT): Cranston Neighbor   Encounter Date: 09/06/2020   OT End of Session - 09/06/20 0841    Visit Number 5    Number of Visits 12    Date for OT Re-Evaluation 10/11/20    OT Start Time 0830    OT Stop Time 0905    OT Time Calculation (min) 35 min    Activity Tolerance Patient tolerated treatment well    Behavior During Therapy [facility] for tasks assessed/performed           Past Medical History:  Diagnosis Date  . Pneumonia     Past Surgical History:  Procedure Laterality Date  . AUGMENTATION MAMMAPLASTY    . CHEST TUBE INSERTION Left 05/2008  . OPEN REDUCTION INTERNAL FIXATION (ORIF) PROXIMAL PHALANX Left 07/29/2020   Procedure: OPEN REDUCTION INTERNAL FIXATION (ORIF) OF LEFT 4TH DIGIT  PROXIMAL PHALANX FRACTURE;  Surgeon: Kennedy Bucker, MD;  Location: [facility] OR[facility];  Service: Orthopedics;  Laterality: Left;    There were no vitals filed for this visit.   Subjective Assessment - 09/06/20 1014    Subjective  You are not going to be happy with me - I did not had time to do my exercises as I should have - was with my son in [place] and [place] - touring colleges - but worked it some    Pertinent History Pt had fx of L 4th digit prox phalanges on 07/25/2020- ended up with ORIF on 07/29/2020 - was in buddystrap and refer to OT for ROM    Patient Stated Goals Want to be able to  use my hand like before - make fist and be able to grip , hold objects, play volleyball , lift weights, housewrok    Currently in Pain? Yes    Pain Score 2     Pain Location Finger (Comment which one)    Pain Orientation Left    Pain Descriptors / Indicators Tightness;Tender;Stabbing    Pain Type Surgical pain    Pain Onset More than a  month ago    Pain Frequency Intermittent    Aggravating Factors  scar mobs and AAROM              [facility] OT Assessment - 09/06/20 0001      Left Hand AROM   L Ring  MCP 0-90 95 Degrees    L Ring PIP 0-100 70 Degrees   75 in session   L Ring DIP 0-70 60 Degrees             pt show increase AROM at PIP to 70 coming in         OT Treatments/Exercises (OP) - 09/06/20 0001      RUE Paraffin   Number Minutes Paraffin 8 Minutes    RUE Paraffin Location Hand    Comments prior to gentle stretch and scar mobs             Scar massagedone by OT - using extractor 3-4 x , - attempt to do with MC in flexion  manual by OT and mini massager -  - review cont to do  kinesiotape to doin star over scar adhesion   - 100% pull - to wear during day for scar mobilization -  Ed pt how to apply and let it breath at night    PROM to DIP of 4th10 reps Blocked AROM for DIP and PIP flexion  Gentle AAROM done and kept 3 x 1 min to PIP flexion with MC at 90  With extention inbetween and tapping of extention of digits Tendon glides -composote flexion to 2 cm foam block  add and Reviewagainpt to keep pen inpalm at [facility] to block MC not to over flex more than 90  To put force at PIP 10reps Composite to narrow blue foam with and then without buddy strap- pt to alternate buddy strap to 5th - to decrease crossing over of 5th  Was able to get 75 degrees of flexion at PIPin session and composite - pt to focus on DIP flexion during composite  Keep wrist straight   Wear silicon sleeve night time and during day when typing          OT Education - 09/06/20 1016    Education Details progress and review HEP    Person(s) Educated Patient    Methods Explanation;Demonstration;Verbal cues;Handout;Tactile cues    Comprehension Returned demonstration;Verbal cues required;Verbalized understanding            OT Short Term Goals - 08/16/20 1449      OT SHORT TERM GOAL #1   Title  Pt to be independent in HEP to decrease scar tissue and increase AROM to 4th digit to grasp utencils , brush , deodorant    Baseline decrease AROM - cannot grip cylinder objects    Time 4    Period Weeks    Status New    Target Date 09/13/20             OT Long Term Goals - 08/16/20 1451      OT LONG TERM GOAL #1   Title L 4th digit PIP flexion , composite flexion to be able to touch palm to hold jewelry and money    Baseline decrease flexion at PIP and DIP - composite fis - cannot grasp or hold objects    Time 6    Period Weeks    Status New    Target Date 09/27/20      OT LONG TERM GOAL #2   Title L grip strength increase to more than 50% compare to R to carry groceries and more than 10 lbs without symptoms    Baseline 2 1/2 wks s/p    Time 8    Period Weeks    Status New    Target Date 10/11/20      OT LONG TERM GOAL #3   Title Function score on PRWHE improve to less than 5/50 to do weights, house work , Location manager    Baseline 2 1/2 wks s/p ORIF 4th proxim phalanges fx    Time 8    Period Weeks    Status New    Target Date 10/11/20                 Plan - 09/06/20 1017    Clinical Impression Statement Pt is 5 1/2 wks s/p L 4th prox phalanges fx with ORIF - pt cont to have scar adhesion at proximal scar - making steady progress at PIP flexion and composite - pt to focus on scar mobs and composite flexion and PIP flexion AAROM gentle - until next xray    OT Occupational Profile and History Problem Focused Assessment - Including review of records relating to presenting  problem    Occupational performance deficits (Please refer to evaluation for details): ADL's;IADL's;Work;Play;Leisure;Social Participation    Body Structure / Function / Physical Skills ADL;Edema;Dexterity;Decreased knowledge of precautions;Flexibility;ROM;UE functional use;Scar mobility;IADL;Pain;Strength    Rehab Potential Good    Clinical Decision Making Limited treatment options, no task  modification necessary    Comorbidities Affecting Occupational Performance: None    Modification or Assistance to Complete Evaluation  No modification of tasks or assist necessary to complete eval    OT Frequency 2x / week    OT Duration 8 weeks    OT Treatment/Interventions Self-care/ADL training;Moist Heat;Paraffin;Fluidtherapy;Contrast Bath;Manual Therapy;Passive range of motion;Scar mobilization;Splinting;Patient/family education;Therapeutic exercise    Plan assess progress and upgrade as needed    OT Home Exercise Plan see pt instruction    Consulted and Agree with Plan of Care Patient           Patient will benefit from skilled therapeutic intervention in order to improve the following deficits and impairments:   Body Structure / Function / Physical Skills: ADL, Edema, Dexterity, Decreased knowledge of precautions, Flexibility, ROM, UE functional use, Scar mobility, IADL, Pain, Strength       Visit Diagnosis: Stiffness of left hand, not elsewhere classified  Scar condition and fibrosis of skin  Muscle weakness (generalized)  Localized edema    Problem List There are no problems to display for this patient.   Oletta Cohn OTR/l,CLT 09/06/2020, 10:20 AM   [facility] PHYSICAL AND SPORTS MEDICINE [address], [place], [address] Phone: [phone]   Fax:  [phone]  Name: Debra Shelton MRN: [id number] Date of Birth: 08/09/1976

## 2020-09-09 ENCOUNTER — Other Ambulatory Visit: Payer: Self-pay

## 2020-09-09 ENCOUNTER — Ambulatory Visit: Payer: 59 | Admitting: Occupational Therapy

## 2020-09-09 DIAGNOSIS — R600 Localized edema: Secondary | ICD-10-CM

## 2020-09-09 DIAGNOSIS — M25642 Stiffness of left hand, not elsewhere classified: Secondary | ICD-10-CM | POA: Diagnosis not present

## 2020-09-09 DIAGNOSIS — M6281 Muscle weakness (generalized): Secondary | ICD-10-CM

## 2020-09-09 DIAGNOSIS — L905 Scar conditions and fibrosis of skin: Secondary | ICD-10-CM

## 2020-09-09 NOTE — Therapy (Signed)
[facility] PH[facility]ORTS MEDICINE [address] [place], [address] Phone: [phone]   Fax:  [phone]  Occupational Therapy Treatment  Patient Details  Name: Debra Shelton MRN: [id number] Date of Birth: [birth year] Referring Provider (OT): Cranston Neighbor   Encounter Date: 09/09/2020   OT End of Session - 09/09/20 1215    Visit Number 6    Number of Visits 12    Date for OT Re-Evaluation 10/11/20    OT Start Time 1118    OT Stop Time 1150    OT Time Calculation (min) 32 min    Activity Tolerance Patient tolerated treatment well    Behavior During Therapy [facility] for tasks assessed/performed           Past Medical History:  Diagnosis Date  . Pneumonia     Past Surgical History:  Procedure Laterality Date  . AUGMENTATION MAMMAPLASTY    . CHEST TUBE INSERTION Left 05/2008  . OPEN REDUCTION INTERNAL FIXATION (ORIF) PROXIMAL PHALANX Left 07/29/2020   Procedure: OPEN REDUCTION INTERNAL FIXATION (ORIF) OF LEFT 4TH DIGIT  PROXIMAL PHALANX FRACTURE;  Surgeon: Kennedy Bucker, MD;  Location: [facility] OR[facility];  Service: Orthopedics;  Laterality: Left;    There were no vitals filed for this visit.   Subjective Assessment - 09/09/20 1214    Subjective  Doing okay - I can tell my finger is getting better - bending more - but that scar still stuck    Pertinent History Pt had fx of L 4th digit prox phalanges on 07/25/2020- ended up with ORIF on 07/29/2020 - was in buddystrap and refer to OT for ROM    Patient Stated Goals Want to be able to  use my hand like before - make fist and be able to grip , hold objects, play volleyball , lift weights, housewrok    Currently in Pain? No/denies              [facility] OT Assessment - 09/09/20 0001      Left Hand AROM   L Ring  MCP 0-90 95 Degrees    L Ring PIP 0-100 70 Degrees   75 in session    L Ring DIP 0-70 60 Degrees                    OT Treatments/Exercises (OP) - 09/09/20 0001       RUE Paraffin   Number Minutes Paraffin 8 Minutes    RUE Paraffin Location Hand    Comments hand in loose fist during paraffin          scar adhesion - done xtractor and mini massager again this date -  PIP extention at -15  kinesiotape applied - with X over scar with 100% traction    PROM to DIP of 4th10 reps Blocked AROM for DIP and PIP flexion Gentle AAROM done and kept 3 x 1 min to PIP flexion with MC at 90  With extention inbetween and tapping of extention of digits; rolling into extention over foam roller Tendon glides -composote flexion to 2 cm foam block  Composite to narrow blue foam with and then without buddy strap- pt to alternate buddy strap to 5th - to decrease crossing over of 5th Was able to get75degrees of flexion at PIPin session easy this date and working on composite flexion around cylinder object - pt to focus on DIP flexion during composite  Keep wrist straight   Wear silicon sleeve night  time and during day when typing          OT Education - 09/09/20 1215    Education Details progress and review HEP    Person(s) Educated Patient    Methods Explanation;Demonstration;Verbal cues;Handout;Tactile cues    Comprehension Returned demonstration;Verbal cues required;Verbalized understanding            OT Short Term Goals - 08/16/20 1449      OT SHORT TERM GOAL #1   Title Pt to be independent in HEP to decrease scar tissue and increase AROM to 4th digit to grasp utencils , brush , deodorant    Baseline decrease AROM - cannot grip cylinder objects    Time 4    Period Weeks    Status New    Target Date 09/13/20             OT Long Term Goals - 08/16/20 1451      OT LONG TERM GOAL #1   Title L 4th digit PIP flexion , composite flexion to be able to touch palm to hold jewelry and money    Baseline decrease flexion at PIP and DIP - composite fis - cannot grasp or hold objects    Time 6    Period Weeks    Status New    Target Date 09/27/20        OT LONG TERM GOAL #2   Title L grip strength increase to more than 50% compare to R to carry groceries and more than 10 lbs without symptoms    Baseline 2 1/2 wks s/p    Time 8    Period Weeks    Status New    Target Date 10/11/20      OT LONG TERM GOAL #3   Title Function score on PRWHE improve to less than 5/50 to do weights, house work , Location manager    Baseline 2 1/2 wks s/p ORIF 4th proxim phalanges fx    Time 8    Period Weeks    Status New    Target Date 10/11/20                 Plan - 09/09/20 1215    Clinical Impression Statement Pt is 6 wlks s/p L 4th prox phalanges fx with ORIF - pt cont to show scar tissue that is adhere proximal and limiting end range extention - done xtractor, taping , massage , mini massagr - making progress in flexion - this date easy 75 PIP flexion in session - cont to increase AROM , PROM and initate strengthening when approve by MD    OT Occupational Profile and History Problem Focused Assessment - Including review of records relating to presenting problem    Occupational performance deficits (Please refer to evaluation for details): ADL's;IADL's;Work;Play;Leisure;Social Participation    Body Structure / Function / Physical Skills ADL;Edema;Dexterity;Decreased knowledge of precautions;Flexibility;ROM;UE functional use;Scar mobility;IADL;Pain;Strength    Rehab Potential Good    Clinical Decision Making Limited treatment options, no task modification necessary    Comorbidities Affecting Occupational Performance: None    Modification or Assistance to Complete Evaluation  No modification of tasks or assist necessary to complete eval    OT Frequency 2x / week    OT Duration 8 weeks    OT Treatment/Interventions Self-care/ADL training;Moist Heat;Paraffin;Fluidtherapy;Contrast Bath;Manual Therapy;Passive range of motion;Scar mobilization;Splinting;Patient/family education;Therapeutic exercise    Plan assess progress and upgrade as needed     OT Home Exercise Plan see pt instruction  Consulted and Agree with Plan of Care Patient           Patient will benefit from skilled therapeutic intervention in order to improve the following deficits and impairments:   Body Structure / Function / Physical Skills: ADL, Edema, Dexterity, Decreased knowledge of precautions, Flexibility, ROM, UE functional use, Scar mobility, IADL, Pain, Strength       Visit Diagnosis: Stiffness of left hand, not elsewhere classified  Scar condition and fibrosis of skin  Muscle weakness (generalized)  Localized edema    Problem List There are no problems to display for this patient.   Oletta Cohn OTR/l,CLT 09/09/2020, 12:19 PM  [facility] PHYSICAL AND SPORTS MEDICINE [address] [place], [address] Phone: [phone]   Fax:  [phone]  Name: Debra Shelton MRN: [id number] Date of Birth: 25-Sep-1976

## 2020-09-14 ENCOUNTER — Other Ambulatory Visit: Payer: Self-pay

## 2020-09-14 ENCOUNTER — Ambulatory Visit: Payer: 59 | Admitting: Occupational Therapy

## 2020-09-14 DIAGNOSIS — M25642 Stiffness of left hand, not elsewhere classified: Secondary | ICD-10-CM | POA: Diagnosis not present

## 2020-09-14 DIAGNOSIS — L905 Scar conditions and fibrosis of skin: Secondary | ICD-10-CM

## 2020-09-14 DIAGNOSIS — M6281 Muscle weakness (generalized): Secondary | ICD-10-CM

## 2020-09-14 DIAGNOSIS — R600 Localized edema: Secondary | ICD-10-CM

## 2020-09-14 NOTE — Therapy (Signed)
[place] [facility] [address] [place], [address] Phone: [phone]   Fax:  [phone]  Occupational Therapy Treatment  Patient Details  Name: Debra Shelton MRN: [id number] Date of Birth: 23-Jul-1976 Referring Provider (OT): Cranston Neighbor   Encounter Date: 09/14/2020   OT End of Session - 09/14/20 1649    Visit Number 7    Number of Visits 12    Date for OT Re-Evaluation 10/11/20    OT Start Time 1448    OT Stop Time 1530    OT Time Calculation (min) 42 min    Activity Tolerance Patient tolerated treatment well    Behavior During Therapy [facility] for tasks assessed/performed           Past Medical History:  Diagnosis Date  . Pneumonia     Past Surgical History:  Procedure Laterality Date  . AUGMENTATION MAMMAPLASTY    . CHEST TUBE INSERTION Left 05/2008  . OPEN REDUCTION INTERNAL FIXATION (ORIF) PROXIMAL PHALANX Left 07/29/2020   Procedure: OPEN REDUCTION INTERNAL FIXATION (ORIF) OF LEFT 4TH DIGIT  PROXIMAL PHALANX FRACTURE;  Surgeon: Kennedy Bucker, MD;  Location: [facility] ORS;  Service: Orthopedics;  Laterality: Left;    There were no vitals filed for this visit.   Subjective Assessment - 09/14/20 1648    Subjective  I did not do the shots for my scar - my scar is larger and will take more than 1 visit to do - they metion hard ware removal - I can bend my finger little better    Pertinent History Pt had fx of L 4th digit prox phalanges on 07/25/2020- ended up with ORIF on 07/29/2020 - was in buddystrap and refer to OT for ROM    Patient Stated Goals Want to be able to  use my hand like before - make fist and be able to grip , hold objects, play volleyball , lift weights, housewrok    Currently in Pain? No/denies             Pt arrive with flexion of 4th PIP 75 degrees and PROM 80  Scar adhesions from proximal to PIP to [facility]            OT Treatments/Exercises (OP) - 09/14/20 0001      RUE  Paraffin   Number Minutes Paraffin 8 Minutes    RUE Paraffin Location Hand    Comments loose fist around thin cylinder - with coban flexion stretch            scar adhesion - done xtractor this date with more success because of edema decrease - and done in fist position -with AROM of PIP ext and flexion  Several times with great success - pt to use coban piece for scar massage in same position over the rest of week out of town and weekend     PIP extention at -15      PROM to DIP of 4th10 reps Gentle PROM for PIP/ DIP flexion stretch  Gentle stretch in composite with MC block at 90 and PIP  Around cylinder object that is about  1 1/2 cm   With extention inbetween and tapping of extention of digits; rolling into extention over foam roller Tendon glides -composote flexion to 1 1/2 cm foam block  Composite to narrow blue foam with and then without buddy strap-  pt to alternate buddy strap to 5th - to decrease crossing over of 5th- stopped doing it -  remind to cont with Was able to get80 to 85degrees of flexion at [facility] sessionand working on composite flexion around cylinder object - pt to focus on DIP flexion during composite Keep wrist straight and MC at 90   Wear silicon sleeve night time and during day when typing         OT Education - 09/14/20 1649    Education Details progress and review HEP    Person(s) Educated Patient    Methods Explanation;Demonstration;Verbal cues;Handout;Tactile cues    Comprehension Returned demonstration;Verbal cues required;Verbalized understanding            OT Short Term Goals - 08/16/20 1449      OT SHORT TERM GOAL #1   Title Pt to be independent in HEP to decrease scar tissue and increase AROM to 4th digit to grasp utencils , brush , deodorant    Baseline decrease AROM - cannot grip cylinder objects    Time 4    Period Weeks    Status New    Target Date 09/13/20             OT Long Term Goals - 08/16/20 1451      OT  LONG TERM GOAL #1   Title L 4th digit PIP flexion , composite flexion to be able to touch palm to hold jewelry and money    Baseline decrease flexion at PIP and DIP - composite fis - cannot grasp or hold objects    Time 6    Period Weeks    Status New    Target Date 09/27/20      OT LONG TERM GOAL #2   Title L grip strength increase to more than 50% compare to R to carry groceries and more than 10 lbs without symptoms    Baseline 2 1/2 wks s/p    Time 8    Period Weeks    Status New    Target Date 10/11/20      OT LONG TERM GOAL #3   Title Function score on PRWHE improve to less than 5/50 to do weights, house work , Location manager    Baseline 2 1/2 wks s/p ORIF 4th proxim phalanges fx    Time 8    Period Weeks    Status New    Target Date 10/11/20                 Plan - 09/14/20 1650    Clinical Impression Statement Pt is in2 days 7 wks s/p L 4th prox phalanges fx with ORIF - pt show decrease edema and able to get xtractor on scar this date with had in fist to decrease adhesions - pt show increase AROM - upon arrival able to get 75 AROM and in session 80 to 85 degrees - pt to cont to work scar at home , and AROM to PIP - use buddy strap to decrease cross over of 5th    OT Occupational Profile and History Problem Focused Assessment - Including review of records relating to presenting problem    Occupational performance deficits (Please refer to evaluation for details): ADL's;IADL's;Work;Play;Leisure;Social Participation    Body Structure / Function / Physical Skills ADL;Edema;Dexterity;Decreased knowledge of precautions;Flexibility;ROM;UE functional use;Scar mobility;IADL;Pain;Strength    Rehab Potential Good    Clinical Decision Making Limited treatment options, no task modification necessary    Comorbidities Affecting Occupational Performance: None    Modification or Assistance to Complete Evaluation  No modification of tasks or assist necessary to  complete eval    OT  Frequency 2x / week    OT Duration 8 weeks    OT Treatment/Interventions Self-care/ADL training;Moist Heat;Paraffin;Fluidtherapy;Contrast Bath;Manual Therapy;Passive range of motion;Scar mobilization;Splinting;Patient/family education;Therapeutic exercise    Plan assess progress and upgrade as needed    OT Home Exercise Plan see pt instruction    Consulted and Agree with Plan of Care Patient           Patient will benefit from skilled therapeutic intervention in order to improve the following deficits and impairments:   Body Structure / Function / Physical Skills: ADL, Edema, Dexterity, Decreased knowledge of precautions, Flexibility, ROM, UE functional use, Scar mobility, IADL, Pain, Strength       Visit Diagnosis: Stiffness of left hand, not elsewhere classified  Scar condition and fibrosis of skin  Muscle weakness (generalized)  Localized edema    Problem List There are no problems to display for this patient.   Oletta Cohn OTR/L,CLT 09/14/2020, 4:53 PM  [place] [facility] PHYSICAL AND SPORTS MEDICINE [address] [place], [address] Phone: [phone]   Fax:  [phone]  Name: PAMELYN BANCROFT MRN: [id number] Date of Birth: October 31, 1976

## 2020-09-21 ENCOUNTER — Ambulatory Visit: Payer: 59 | Admitting: Occupational Therapy

## 2020-09-23 ENCOUNTER — Ambulatory Visit: Payer: 59 | Attending: Orthopedic Surgery | Admitting: Occupational Therapy

## 2020-09-23 ENCOUNTER — Other Ambulatory Visit: Payer: Self-pay

## 2020-09-23 DIAGNOSIS — M6281 Muscle weakness (generalized): Secondary | ICD-10-CM | POA: Diagnosis present

## 2020-09-23 DIAGNOSIS — R600 Localized edema: Secondary | ICD-10-CM | POA: Diagnosis present

## 2020-09-23 DIAGNOSIS — M25642 Stiffness of left hand, not elsewhere classified: Secondary | ICD-10-CM | POA: Diagnosis not present

## 2020-09-23 DIAGNOSIS — L905 Scar conditions and fibrosis of skin: Secondary | ICD-10-CM | POA: Diagnosis present

## 2020-09-23 NOTE — Therapy (Signed)
[facility]TS MEDICINE [address], [place], [address] Phone: [phone]   Fax:  [phone]  Occupational Therapy Treatment  Patient Details  Name: Debra Shelton MRN: [id number] Date of Birth: [birth year] Referring Provider (OT): Cranston Neighbor   Encounter Date: 09/23/2020   OT End of Session - 09/23/20 0933    Visit Number 8    Number of Visits 12    Date for OT Re-Evaluation 10/11/20    OT Start Time 0816    OT Stop Time 0857    OT Time Calculation (min) 41 min    Activity Tolerance Patient tolerated treatment well    Behavior During Therapy [facility] for tasks assessed/performed           Past Medical History:  Diagnosis Date  . Pneumonia     Past Surgical History:  Procedure Laterality Date  . AUGMENTATION MAMMAPLASTY    . CHEST TUBE INSERTION Left 05/2008  . OPEN REDUCTION INTERNAL FIXATION (ORIF) PROXIMAL PHALANX Left 07/29/2020   Procedure: OPEN REDUCTION INTERNAL FIXATION (ORIF) OF LEFT 4TH DIGIT  PROXIMAL PHALANX FRACTURE;  Surgeon: Kennedy Bucker, MD;  Location: [facility] ORS;  Service: Orthopedics;  Laterality: Left;    There were no vitals filed for this visit.   Subjective Assessment - 09/23/20 0931    Subjective  I done my exercises while I was in [place] -  I think the scar is moving little better and bending it little better too    Pertinent History Pt had fx of L 4th digit prox phalanges on 07/25/2020- ended up with ORIF on 07/29/2020 - was in buddystrap and refer to OT for ROM    Patient Stated Goals Want to be able to  use my hand like before - make fist and be able to grip , hold objects, play volleyball , lift weights, housewrok    Currently in Pain? No/denies              [facility] OT Assessment - 09/23/20 0001      Left Hand AROM   L Ring  MCP 0-90 95 Degrees    L Ring PIP 0-100 80 Degrees   in session85 AROM , PROM 90   L Ring DIP 0-70 60 Degrees                    OT  Treatments/Exercises (OP) - 09/23/20 0001      RUE Paraffin   Number Minutes Paraffin 8 Minutes    RUE Paraffin Location Hand    Comments intrinsic fist gentle stretch prior to soft tissue and ROM            scar adhesion - done xtractor this date with more success again because of edema decrease - and done in fist position -with AROM of PIP ext and flexion  And using coban for scar massage in session too Several times with great success - pt to cont using coban piece for scar massage with finger in flexion    PIP extention at -5 this date   PROM to DIP of 4th10 reps Gentle prolonged flexion stretch for PIP/ DIP flexion  Gentle stretch in composite with MC block at 90 and PIP around cylinder object that is about  1 1/2 cm(highlighter)   With extention inbetween and tapping of extention of digits; rolling into extention over foam roller Tendon glides with focus on intrinsic fist and composote flexion   pt to use buddy  strap to 3rd - to decrease crossing over 4th now-she stopped doing it - remind to cont with Was able to get 85degrees to 90 degrees of flexion at PIPin sessionand working oncompositeflexion around cylinder object- pt to focus on DIP flexion during composite Keep wrist straight and MC at 90   Wear silicon sleeve night time and during day when typing          OT Education - 09/23/20 0933    Education Details progress and review HEP    Person(s) Educated Patient    Methods Explanation;Demonstration;Verbal cues;Handout;Tactile cues    Comprehension Returned demonstration;Verbal cues required;Verbalized understanding            OT Short Term Goals - 08/16/20 1449      OT SHORT TERM GOAL #1   Title Pt to be independent in HEP to decrease scar tissue and increase AROM to 4th digit to grasp utencils , brush , deodorant    Baseline decrease AROM - cannot grip cylinder objects    Time 4    Period Weeks    Status New    Target Date 09/13/20               OT Long Term Goals - 08/16/20 1451      OT LONG TERM GOAL #1   Title L 4th digit PIP flexion , composite flexion to be able to touch palm to hold jewelry and money    Baseline decrease flexion at PIP and DIP - composite fis - cannot grasp or hold objects    Time 6    Period Weeks    Status New    Target Date 09/27/20      OT LONG TERM GOAL #2   Title L grip strength increase to more than 50% compare to R to carry groceries and more than 10 lbs without symptoms    Baseline 2 1/2 wks s/p    Time 8    Period Weeks    Status New    Target Date 10/11/20      OT LONG TERM GOAL #3   Title Function score on PRWHE improve to less than 5/50 to do weights, house work , Location manager    Baseline 2 1/2 wks s/p ORIF 4th proxim phalanges fx    Time 8    Period Weeks    Status New    Target Date 10/11/20                 Plan - 09/23/20 0934    Clinical Impression Statement Pt is 8wks s/p L 4th proximal phalanges fx with ORIF - pt cont to show increase PROM and AROM in 4th digit PIP, decrease edema- and appear scar tissue adhesions improving - extention of digit -5 this date - was -15 - pt to cont with increase PIP flexion -did had some ulnar devation of 4th at PIP - remind again with pt to wear her buddy strap some during day to align 4th digit    OT Occupational Profile and History Problem Focused Assessment - Including review of records relating to presenting problem    Occupational performance deficits (Please refer to evaluation for details): ADL's;IADL's;Work;Play;Leisure;Social Participation    Body Structure / Function / Physical Skills ADL;Edema;Dexterity;Decreased knowledge of precautions;Flexibility;ROM;UE functional use;Scar mobility;IADL;Pain;Strength    Rehab Potential Good    Clinical Decision Making Limited treatment options, no task modification necessary    Comorbidities Affecting Occupational Performance: None    Modification or Assistance  to Complete  Evaluation  No modification of tasks or assist necessary to complete eval    OT Frequency 2x / week    OT Duration 8 weeks    OT Treatment/Interventions Self-care/ADL training;Moist Heat;Paraffin;Fluidtherapy;Contrast Bath;Manual Therapy;Passive range of motion;Scar mobilization;Splinting;Patient/family education;Therapeutic exercise    Plan assess progress and upgrade as needed    OT Home Exercise Plan see pt instruction    Consulted and Agree with Plan of Care Patient           Patient will benefit from skilled therapeutic intervention in order to improve the following deficits and impairments:   Body Structure / Function / Physical Skills: ADL, Edema, Dexterity, Decreased knowledge of precautions, Flexibility, ROM, UE functional use, Scar mobility, IADL, Pain, Strength       Visit Diagnosis: Stiffness of left hand, not elsewhere classified  Scar condition and fibrosis of skin  Muscle weakness (generalized)  Localized edema    Problem List There are no problems to display for this patient.   Oletta Cohn OTR/L,CLT 09/23/2020, 9:39 AM   [facility] PHYSICAL AND SPORTS MEDICINE [address], [place], [address] Phone: [phone]   Fax:  [phone]  Name: Debra Shelton MRN: [id number] Date of Birth: May 12, 1976

## 2020-09-28 ENCOUNTER — Ambulatory Visit: Payer: 59 | Admitting: Occupational Therapy

## 2020-09-28 ENCOUNTER — Other Ambulatory Visit: Payer: Self-pay

## 2020-09-28 DIAGNOSIS — L905 Scar conditions and fibrosis of skin: Secondary | ICD-10-CM

## 2020-09-28 DIAGNOSIS — M25642 Stiffness of left hand, not elsewhere classified: Secondary | ICD-10-CM

## 2020-09-28 DIAGNOSIS — R600 Localized edema: Secondary | ICD-10-CM

## 2020-09-28 DIAGNOSIS — M6281 Muscle weakness (generalized): Secondary | ICD-10-CM

## 2020-09-28 NOTE — Therapy (Signed)
[place] [facility] PHYS[facility]TS MEDICINE [address], [place], [address] Phone: [phone]   Fax:  [phone]  Occupational Therapy Treatment  Patient Details  Name: Debra Shelton MRN: [id number] Date of Birth: [birth year] Referring Provider (OT): Cranston Neighbor   Encounter Date: 09/28/2020   OT End of Session - 09/28/20 0849    Visit Number 9    Number of Visits 12    Date for OT Re-Evaluation 10/11/20    OT Start Time 0820    OT Stop Time 0901    OT Time Calculation (min) 41 min    Activity Tolerance Patient tolerated treatment well    Behavior During Therapy [facility] for tasks assessed/performed           Past Medical History:  Diagnosis Date  . Pneumonia     Past Surgical History:  Procedure Laterality Date  . AUGMENTATION MAMMAPLASTY    . CHEST TUBE INSERTION Left 05/2008  . OPEN REDUCTION INTERNAL FIXATION (ORIF) PROXIMAL PHALANX Left 07/29/2020   Procedure: OPEN REDUCTION INTERNAL FIXATION (ORIF) OF LEFT 4TH DIGIT  PROXIMAL PHALANX FRACTURE;  Surgeon: Kennedy Bucker, MD;  Location: [facility] ORS;  Service: Orthopedics;  Laterality: Left;    There were no vitals filed for this visit.   Subjective Assessment - 09/28/20 0843    Subjective  Getting better- I can go faster out of bending and straigthening - scar moving better and finger more straight    Pertinent History Pt had fx of L 4th digit prox phalanges on 07/25/2020- ended up with ORIF on 07/29/2020 - was in buddystrap and refer to OT for ROM    Patient Stated Goals Want to be able to  use my hand like before - make fist and be able to grip , hold objects, play volleyball , lift weights, housewrok    Currently in Pain? No/denies              [facility] OT Assessment - 09/28/20 0001      Left Hand AROM   L Ring  MCP 0-90 97 Degrees    L Ring PIP 0-100 80 Degrees   85 in ession , Ulnar dev at PIP with fisting 8   L Ring DIP 0-70 70 Degrees          Pt report increase  stiffness this am and swelling - but increase ease with going between flexion and extention  And scar tissue improving with increase extention of 4th digit          OT Treatments/Exercises (OP) - 09/28/20 0001      RUE Paraffin   Number Minutes Paraffin 8 Minutes    RUE Paraffin Location Hand    Comments compositer flexion for 4th -            scar adhesion - done xtractorthis date with more success again  but not on middle phalanges - had little more edema than last time  - and done in fist position -with AROM of PIP ext and flexion  And using coban for scar massage in session too Several times with great success - pt to cont using coban piece for scar massage with finger in flexion     PROM to DIP of 4th10 reps Gentle prolonged flexion stretch for PIP/DIP flexion  Gentlestretch in composite with MC block at 90 and PIP around cylinder object that is about 1 1/2 cm(highlighter)  With extention inbetween and tapping of extention of digits; rolling  into extention over foam roller Tendon glides with focus on intrinsic fist and composote flexion This date fabricated DIP/PIP strap to use 45 sec to 2 min at time during day prior to fisting to increase PIP flexion - pull should be less than 1-2/10 - and now increase pain and edema   pt to use buddy strap to 3rd - to decrease crossing over 4th now-she stopped doing it - remind to cont with and fitted with oval 8 to use other times - modify to decrease deviation from 8 to 5 degrees  Was able to get 85degrees  of flexion at PIPin sessionand working oncompositeflexion around cylinder object- pt to focus on DIP flexion during composite Keep wrist straightand MC at 90  Wear silicon sleeve night time and during day when typing        OT Education - 09/28/20 0849    Education Details progress and review HEP    Person(s) Educated Patient    Methods Explanation;Demonstration;Verbal cues;Handout;Tactile cues     Comprehension Returned demonstration;Verbal cues required;Verbalized understanding            OT Short Term Goals - 08/16/20 1449      OT SHORT TERM GOAL #1   Title Pt to be independent in HEP to decrease scar tissue and increase AROM to 4th digit to grasp utencils , brush , deodorant    Baseline decrease AROM - cannot grip cylinder objects    Time 4    Period Weeks    Status New    Target Date 09/13/20             OT Long Term Goals - 08/16/20 1451      OT LONG TERM GOAL #1   Title L 4th digit PIP flexion , composite flexion to be able to touch palm to hold jewelry and money    Baseline decrease flexion at PIP and DIP - composite fis - cannot grasp or hold objects    Time 6    Period Weeks    Status New    Target Date 09/27/20      OT LONG TERM GOAL #2   Title L grip strength increase to more than 50% compare to R to carry groceries and more than 10 lbs without symptoms    Baseline 2 1/2 wks s/p    Time 8    Period Weeks    Status New    Target Date 10/11/20      OT LONG TERM GOAL #3   Title Function score on PRWHE improve to less than 5/50 to do weights, house work , Location manager    Baseline 2 1/2 wks s/p ORIF 4th proxim phalanges fx    Time 8    Period Weeks    Status New    Target Date 10/11/20                 Plan - 09/28/20 1003    Clinical Impression Statement Pt is about 88 1/2 wks s/p L 4th Proximal phalanges fx with ORIF - pt cont to show decrease scar tissue adhesions, increase MC and PIP extention , and increase flexion at PIP- MC wants to over take and ulnar devation out of PIP with composite flexion- but fitted with oval 8 this date to use if not using buddystrap - pt to focus on DIP/PIP flexion this week    OT Occupational Profile and History Problem Focused Assessment - Including review of records relating to presenting  problem    Occupational performance deficits (Please refer to evaluation for details):  ADL's;IADL's;Work;Play;Leisure;Social Participation    Body Structure / Function / Physical Skills ADL;Edema;Dexterity;Decreased knowledge of precautions;Flexibility;ROM;UE functional use;Scar mobility;IADL;Pain;Strength    Rehab Potential Good    Clinical Decision Making Limited treatment options, no task modification necessary    Comorbidities Affecting Occupational Performance: None    Modification or Assistance to Complete Evaluation  No modification of tasks or assist necessary to complete eval    OT Frequency 2x / week    OT Duration 8 weeks    OT Treatment/Interventions Self-care/ADL training;Moist Heat;Paraffin;Fluidtherapy;Contrast Bath;Manual Therapy;Passive range of motion;Scar mobilization;Splinting;Patient/family education;Therapeutic exercise    Plan assess progress and upgrade as needed    OT Home Exercise Plan see pt instruction    Consulted and Agree with Plan of Care Patient           Patient will benefit from skilled therapeutic intervention in order to improve the following deficits and impairments:   Body Structure / Function / Physical Skills: ADL, Edema, Dexterity, Decreased knowledge of precautions, Flexibility, ROM, UE functional use, Scar mobility, IADL, Pain, Strength       Visit Diagnosis: Stiffness of left hand, not elsewhere classified  Scar condition and fibrosis of skin  Muscle weakness (generalized)  Localized edema    Problem List There are no problems to display for this patient.   Oletta Cohn OTR/L,CLT 09/28/2020, 10:12 AM  [place] [facility] PHYSICAL AND SPORTS MEDICINE [address], [place], [address] Phone: [phone]   Fax:  [phone]  Name: Debra Shelton MRN: [id number] Date of Birth: 1976/06/03

## 2020-10-01 ENCOUNTER — Ambulatory Visit: Payer: 59 | Admitting: Occupational Therapy

## 2020-10-14 ENCOUNTER — Other Ambulatory Visit: Payer: Self-pay | Admitting: Orthopedic Surgery

## 2020-10-15 ENCOUNTER — Encounter
Admission: RE | Admit: 2020-10-15 | Discharge: 2020-10-15 | Disposition: A | Discharge status: Home / Self Care | Payer: 59 | Source: Ambulatory Visit | Attending: Orthopedic Surgery | Admitting: Orthopedic Surgery

## 2020-10-15 DIAGNOSIS — Z01818 Encounter for other preprocedural examination: Secondary | ICD-10-CM | POA: Insufficient documentation

## 2020-10-15 NOTE — Patient Instructions (Signed)
Your procedure is scheduled on: 10/19/20- Tuesday Report to Day Surgery on the 2nd floor [facility]. To find out your arrival time, please call [phone] between 1PM - 3PM on: 10/18/20- Monday Covid Testing - report to [facility]- 10/18/20- drive -thru  REMEMBER: Instructions that are not followed completely may result in serious medical risk, up to and including death; or upon the discretion of your surgeon and anesthesiologist your surgery may need to be rescheduled.  Do not eat food after midnight the night before surgery.  No gum chewing, lozengers or hard candies.  You may however, drink CLEAR liquids up to 2 hours before you are scheduled to arrive for your surgery. Do not drink anything within 2 hours of your scheduled arrival time.  Clear liquids include: - water  - apple juice without pulp - gatorade (not RED, PURPLE, OR BLUE) - black coffee or tea (Do NOT add milk or creamers to the coffee or tea) Do NOT drink anything that is not on this list.  In addition, your doctor has ordered for you to drink the provided  Ensure Pre-Surgery Clear Carbohydrate Drink  Drinking this carbohydrate drink up to two hours before surgery helps to reduce insulin resistance and improve patient outcomes. Please complete drinking 2 hours prior to scheduled arrival time.  TAKE THESE MEDICATIONS THE MORNING OF SURGERY WITH A SIP OF WATER: - Tylenol if needed  One week prior to surgery: Stop Anti-inflammatories (NSAIDS) such as Advil, Aleve, Ibuprofen, Motrin, Naproxen, Naprosyn and Aspirin based products such as Excedrin, Goodys Powder, BC Powder.  Stop ANY OVER THE COUNTER supplements until after surgery. (You may continue taking Tylenol, Vitamin D, Vitamin B, and multivitamin.)  No Alcohol for 24 hours before or after surgery.  No Smoking including e-cigarettes for 24 hours prior to surgery.  No chewable tobacco products for at least 6 hours prior to surgery.  No nicotine  patches on the day of surgery.  Do not use any "recreational" drugs for at least a week prior to your surgery.  Please be advised that the combination of cocaine and anesthesia may have negative outcomes, up to and including death. If you test positive for cocaine, your surgery will be cancelled.  On the morning of surgery brush your teeth with toothpaste and water, you may rinse your mouth with mouthwash if you wish. Do not swallow any toothpaste or mouthwash.  Do not wear jewelry, make-up, hairpins, clips or nail polish.  Do not wear lotions, powders, or perfumes.   Do not shave 48 hours prior to surgery.   Contact lenses, hearing aids and dentures may not be worn into surgery.  Do not bring valuables to the hospital. [facility] is not responsible for any missing/lost belongings or valuables.   Use CHG Soap or wipes as directed on instruction sheet.  Notify your doctor if there is any change in your medical condition (cold, fever, infection).  Wear comfortable clothing (specific to your surgery type) to the hospital.  Plan for stool softeners for home use; pain medications have a tendency to cause constipation. You can also help prevent constipation by eating foods high in fiber such as fruits and vegetables and drinking plenty of fluids as your diet allows.  After surgery, you can help prevent lung complications by doing breathing exercises.  Take deep breaths and cough every 1-2 hours. Your doctor may order a device called an Incentive Spirometer to help you take deep breaths. When coughing or sneezing, hold  a pillow firmly against your incision with both hands. This is called "splinting." Doing this helps protect your incision. It also decreases belly discomfort.  If you are being admitted to the hospital overnight, leave your suitcase in the car. After surgery it may be brought to your room.  If you are being discharged the day of surgery, you will not be allowed to drive  home. You will need a responsible adult (18 years or older) to drive you home and stay with you that night.   If you are taking public transportation, you will need to have a responsible adult (18 years or older) with you. Please confirm with your physician that it is acceptable to use public transportation.   Please call the Pre-admissions Testing Dept. at [phone] if you have any questions about these instructions.  Visitation Policy:  Patients undergoing a surgery or procedure may have one family member or support person with them as long as that person is not COVID-19 positive or experiencing its symptoms.  That person may remain in the waiting area during the procedure.  Inpatient Visitation Update:   In an effort to ensure the safety of our team members and our patients, we are implementing a change to our visitation policy:  Effective Monday, Aug. 9, at 7 a.m., inpatients will be allowed one support person.  o The support person may change daily.  o The support person must pass our screening, gel in and out, and wear a mask at all times, including in the patient's room.  o Patients must also wear a mask when staff or their support person are in the room.  o Masking is required regardless of vaccination status.  Systemwide, no visitors 17 or younger.

## 2020-10-18 ENCOUNTER — Other Ambulatory Visit
Admission: RE | Admit: 2020-10-18 | Discharge: 2020-10-18 | Disposition: A | Discharge status: Home / Self Care | Payer: 59 | Source: Ambulatory Visit | Attending: Orthopedic Surgery | Admitting: Orthopedic Surgery

## 2020-10-18 ENCOUNTER — Other Ambulatory Visit: Payer: Self-pay

## 2020-10-18 DIAGNOSIS — Z20822 Contact with and (suspected) exposure to covid-19: Secondary | ICD-10-CM | POA: Diagnosis not present

## 2020-10-18 DIAGNOSIS — Z01812 Encounter for preprocedural laboratory examination: Secondary | ICD-10-CM | POA: Insufficient documentation

## 2020-10-18 LAB — SARS CORONAVIRUS 2 (TAT 6-24 HRS): SARS Coronavirus 2: NEGATIVE

## 2020-10-18 MED ORDER — CHLORHEXIDINE GLUCONATE 0.12 % MT SOLN
15.0000 mL | Freq: Once | OROMUCOSAL | Status: AC
  Administered 2020-10-19: 15 mL via OROMUCOSAL

## 2020-10-18 MED ORDER — CEFAZOLIN SODIUM-DEXTROSE 1-4 GM/50ML-% IV SOLN
1.0000 g | INTRAVENOUS | Status: AC
  Administered 2020-10-19: 1 g via INTRAVENOUS

## 2020-10-18 MED ORDER — LACTATED RINGERS IV SOLN: INTRAVENOUS | Status: DC

## 2020-10-18 MED ORDER — ORAL CARE MOUTH RINSE: 15.0000 mL | Freq: Once | OROMUCOSAL | Status: AC

## 2020-10-18 MED ORDER — FAMOTIDINE 20 MG PO TABS
20.0000 mg | ORAL_TABLET | Freq: Once | ORAL | Status: AC
  Administered 2020-10-19: 20 mg via ORAL

## 2020-10-19 ENCOUNTER — Ambulatory Visit: Payer: 59 | Admitting: Anesthesiology

## 2020-10-19 ENCOUNTER — Ambulatory Visit
Admission: RE | Admit: 2020-10-19 | Discharge: 2020-10-19 | Disposition: A | Discharge status: Home / Self Care | Payer: 59 | Attending: Orthopedic Surgery | Admitting: Orthopedic Surgery

## 2020-10-19 ENCOUNTER — Encounter: Payer: Self-pay | Admitting: Orthopedic Surgery

## 2020-10-19 ENCOUNTER — Encounter
Admission: RE | Disposition: A | Discharge status: Home / Self Care | Payer: Self-pay | Source: Home / Self Care | Attending: Orthopedic Surgery

## 2020-10-19 DIAGNOSIS — S62615A Displaced fracture of proximal phalanx of left ring finger, initial encounter for closed fracture: Secondary | ICD-10-CM | POA: Diagnosis not present

## 2020-10-19 DIAGNOSIS — X58XXXA Exposure to other specified factors, initial encounter: Secondary | ICD-10-CM | POA: Diagnosis not present

## 2020-10-19 DIAGNOSIS — T8484XA Pain due to internal orthopedic prosthetic devices, implants and grafts, initial encounter: Secondary | ICD-10-CM | POA: Insufficient documentation

## 2020-10-19 HISTORY — PX: HARDWARE REMOVAL: SHX979

## 2020-10-19 LAB — POCT PREGNANCY, URINE: Preg Test, Ur: NEGATIVE

## 2020-10-19 SURGERY — REMOVAL, HARDWARE
Anesthesia: General | Site: Ring Finger | Laterality: Left

## 2020-10-19 MED ORDER — METOCLOPRAMIDE HCL 5 MG/ML IJ SOLN: 5.0000 mg | Freq: Three times a day (TID) | INTRAMUSCULAR | Status: DC | PRN

## 2020-10-19 MED ORDER — FENTANYL CITRATE (PF) 100 MCG/2ML IJ SOLN
INTRAMUSCULAR | Status: AC
  Filled 2020-10-19: qty 2

## 2020-10-19 MED ORDER — ONDANSETRON HCL 4 MG PO TABS: 4.0000 mg | ORAL_TABLET | Freq: Four times a day (QID) | ORAL | Status: DC | PRN

## 2020-10-19 MED ORDER — DEXAMETHASONE SODIUM PHOSPHATE 10 MG/ML IJ SOLN
INTRAMUSCULAR | Status: DC | PRN
  Administered 2020-10-19: 5 mg via INTRAVENOUS

## 2020-10-19 MED ORDER — HYDROCODONE-ACETAMINOPHEN 5-325 MG PO TABS
ORAL_TABLET | ORAL | Status: AC
  Administered 2020-10-19: 1 via ORAL
  Filled 2020-10-19: qty 1

## 2020-10-19 MED ORDER — LIDOCAINE HCL (PF) 2 % IJ SOLN
INTRAMUSCULAR | Status: AC
  Filled 2020-10-19: qty 5

## 2020-10-19 MED ORDER — PROPOFOL 10 MG/ML IV BOLUS
INTRAVENOUS | Status: AC
  Filled 2020-10-19: qty 20

## 2020-10-19 MED ORDER — ONDANSETRON HCL 4 MG/2ML IJ SOLN
INTRAMUSCULAR | Status: DC | PRN
  Administered 2020-10-19: 4 mg via INTRAVENOUS

## 2020-10-19 MED ORDER — SODIUM CHLORIDE 0.9 % IV SOLN: INTRAVENOUS | Status: DC

## 2020-10-19 MED ORDER — METOCLOPRAMIDE HCL 10 MG PO TABS: 5.0000 mg | ORAL_TABLET | Freq: Three times a day (TID) | ORAL | Status: DC | PRN

## 2020-10-19 MED ORDER — ACETAMINOPHEN 10 MG/ML IV SOLN
INTRAVENOUS | Status: DC | PRN
  Administered 2020-10-19: 1000 mg via INTRAVENOUS

## 2020-10-19 MED ORDER — HYDROCODONE-ACETAMINOPHEN 5-325 MG PO TABS: 1.0000 | ORAL_TABLET | Freq: Once | ORAL | Status: AC

## 2020-10-19 MED ORDER — MIDAZOLAM HCL 2 MG/2ML IJ SOLN
INTRAMUSCULAR | Status: DC | PRN
  Administered 2020-10-19: 2 mg via INTRAVENOUS

## 2020-10-19 MED ORDER — CEFAZOLIN SODIUM-DEXTROSE 1-4 GM/50ML-% IV SOLN
INTRAVENOUS | Status: AC
  Filled 2020-10-19: qty 50

## 2020-10-19 MED ORDER — PROPOFOL 10 MG/ML IV BOLUS
INTRAVENOUS | Status: DC | PRN
  Administered 2020-10-19: 150 mg via INTRAVENOUS

## 2020-10-19 MED ORDER — DEXAMETHASONE SODIUM PHOSPHATE 10 MG/ML IJ SOLN
INTRAMUSCULAR | Status: AC
  Filled 2020-10-19: qty 1

## 2020-10-19 MED ORDER — HYDROCODONE-ACETAMINOPHEN 5-325 MG PO TABS: 1.0000 | ORAL_TABLET | Freq: Four times a day (QID) | ORAL | 0 refills | Status: DC | PRN

## 2020-10-19 MED ORDER — LIDOCAINE HCL (CARDIAC) PF 100 MG/5ML IV SOSY
PREFILLED_SYRINGE | INTRAVENOUS | Status: DC | PRN
  Administered 2020-10-19: 100 mg via INTRAVENOUS

## 2020-10-19 MED ORDER — MIDAZOLAM HCL 2 MG/2ML IJ SOLN
INTRAMUSCULAR | Status: AC
  Filled 2020-10-19: qty 2

## 2020-10-19 MED ORDER — KETAMINE HCL 10 MG/ML IJ SOLN
INTRAMUSCULAR | Status: DC | PRN
  Administered 2020-10-19: 20 mg via INTRAVENOUS

## 2020-10-19 MED ORDER — ACETAMINOPHEN 10 MG/ML IV SOLN
INTRAVENOUS | Status: AC
  Filled 2020-10-19: qty 100

## 2020-10-19 MED ORDER — ONDANSETRON HCL 4 MG/2ML IJ SOLN
INTRAMUSCULAR | Status: AC
  Filled 2020-10-19: qty 2

## 2020-10-19 MED ORDER — FENTANYL CITRATE (PF) 100 MCG/2ML IJ SOLN
25.0000 ug | INTRAMUSCULAR | Status: DC | PRN
  Administered 2020-10-19: 25 ug via INTRAVENOUS

## 2020-10-19 MED ORDER — ONDANSETRON HCL 4 MG/2ML IJ SOLN: 4.0000 mg | Freq: Once | INTRAMUSCULAR | Status: DC | PRN

## 2020-10-19 MED ORDER — CHLORHEXIDINE GLUCONATE 0.12 % MT SOLN
OROMUCOSAL | Status: AC
  Filled 2020-10-19: qty 15

## 2020-10-19 MED ORDER — FENTANYL CITRATE (PF) 100 MCG/2ML IJ SOLN
INTRAMUSCULAR | Status: DC | PRN
  Administered 2020-10-19 (×4): 25 ug via INTRAVENOUS

## 2020-10-19 MED ORDER — FAMOTIDINE 20 MG PO TABS
ORAL_TABLET | ORAL | Status: AC
  Filled 2020-10-19: qty 1

## 2020-10-19 MED ORDER — ONDANSETRON HCL 4 MG/2ML IJ SOLN: 4.0000 mg | Freq: Four times a day (QID) | INTRAMUSCULAR | Status: DC | PRN

## 2020-10-19 SURGICAL SUPPLY — 43 items
APL PRP STRL LF DISP 70% ISPRP (MISCELLANEOUS) ×1
BNDG CONFORM 2 STRL LF (GAUZE/BANDAGES/DRESSINGS) ×2 IMPLANT
CANISTER SUCT 1200ML W/VALVE (MISCELLANEOUS) ×1 IMPLANT
CHLORAPREP W/TINT 26 (MISCELLANEOUS) ×3 IMPLANT
COVER WAND RF STERILE (DRAPES) ×3 IMPLANT
CUFF TOURN SGL QUICK 24 (TOURNIQUET CUFF) ×3
CUFF TOURN SGL QUICK 30 (TOURNIQUET CUFF)
CUFF TRNQT CYL 24X4X16.5-23 (TOURNIQUET CUFF) IMPLANT
CUFF TRNQT CYL 30X4X21-28X (TOURNIQUET CUFF) IMPLANT
DRAPE C-ARM XRAY 36X54 (DRAPES) ×1 IMPLANT
DRAPE INCISE IOBAN 66X45 STRL (DRAPES) ×1 IMPLANT
DRSG EMULSION OIL 3X8 NADH (GAUZE/BANDAGES/DRESSINGS) ×1 IMPLANT
ELECT CAUTERY BLADE 6.4 (BLADE) ×3 IMPLANT
ELECT REM PT RETURN 9FT ADLT (ELECTROSURGICAL) ×3
ELECTRODE REM PT RTRN 9FT ADLT (ELECTROSURGICAL) ×1 IMPLANT
GAUZE SPONGE 4X4 12PLY STRL (GAUZE/BANDAGES/DRESSINGS) ×3 IMPLANT
GAUZE XEROFORM 1X8 LF (GAUZE/BANDAGES/DRESSINGS) ×3 IMPLANT
GLOVE SURG SYN 9.0  PF PI (GLOVE) ×3
GLOVE SURG SYN 9.0 PF PI (GLOVE) ×1 IMPLANT
GOWN SRG 2XL LVL 4 RGLN SLV (GOWNS) ×1 IMPLANT
GOWN STRL NON-REIN 2XL LVL4 (GOWNS) ×3
GOWN STRL REUS W/ TWL LRG LVL3 (GOWN DISPOSABLE) ×1 IMPLANT
GOWN STRL REUS W/TWL LRG LVL3 (GOWN DISPOSABLE) ×3
KIT TURNOVER KIT A (KITS) ×3 IMPLANT
NDL FILTER BLUNT 18X1 1/2 (NEEDLE) ×1 IMPLANT
NEEDLE FILTER BLUNT 18X 1/2SAF (NEEDLE) ×2
NEEDLE FILTER BLUNT 18X1 1/2 (NEEDLE) ×1 IMPLANT
NS IRRIG 1000ML POUR BTL (IV SOLUTION) ×3 IMPLANT
PACK EXTREMITY (MISCELLANEOUS) ×3 IMPLANT
PAD ABD DERMACEA PRESS 5X9 (GAUZE/BANDAGES/DRESSINGS) ×4 IMPLANT
SCALPEL PROTECTED #15 DISP (BLADE) ×6 IMPLANT
STAPLER SKIN PROX 35W (STAPLE) ×1 IMPLANT
SUT ETHIBOND NAB CT1 #1 30IN (SUTURE) ×1 IMPLANT
SUT ETHILON 3-0 FS-10 30 BLK (SUTURE) ×3
SUT ETHILON NAB PS2 4-0 18IN (SUTURE) ×2 IMPLANT
SUT MNCRL 3 0 RB1 (SUTURE) IMPLANT
SUT MONOCRYL 3 0 RB1 (SUTURE) ×3
SUT VIC AB 0 CT1 36 (SUTURE) ×1 IMPLANT
SUT VIC AB 2-0 CT1 27 (SUTURE)
SUT VIC AB 2-0 CT1 TAPERPNT 27 (SUTURE) ×1 IMPLANT
SUTURE EHLN 3-0 FS-10 30 BLK (SUTURE) ×1 IMPLANT
SYR 10ML LL (SYRINGE) ×3 IMPLANT
WATER STERILE IRR 1000ML POUR (IV SOLUTION) ×1 IMPLANT

## 2020-10-19 NOTE — Discharge Instructions (Addendum)
     AMBULATORY SURGERY  DISCHARGE INSTRUCTIONS  1) The drugs that you were given will stay in your system until tomorrow so for the next 24 hours you should not: A) Drive an automobile B) Make any legal decisions C) Drink any alcoholic beverage  2) You may resume regular meals tomorrow.  Today it is better to start with liquids and gradually work up to solid foods. You may eat anything you prefer, but it is better to start with liquids, then soup and crackers, and gradually work up to solid foods.  3) Please notify your doctor immediately if you have any unusual bleeding, trouble breathing, redness and pain at the surgery site, drainage, fever, or pain not relieved by medication.  4) Additional Instructions: Work on finger motion is much as you can. Pain medicine as directed. Reinforce bandage as needed. Call office if you are having problems Numbing medicine was put on the back of the finger so it may affect your middle and little finger for several hours that would be normal.  Please contact your physician with any problems or Same Day Surgery at [phone], Monday through Friday 6 am to 4 pm, or [place] at [facility] number at [phone].

## 2020-10-19 NOTE — Op Note (Signed)
10/19/2020  12:47 PM  PATIENT:  Debra Shelton  44 y.o. female  PRE-OPERATIVE DIAGNOSIS:  Closed displaced fracture of proximal phalanx of left ring finger, initial encounter S62.615A Painful orthopaedic hardware T84.84XA  POST-OPERATIVE DIAGNOSIS:  Closed displaced fracture of proximal phalanx of left ring finger, initial encounter S62.615A Painful orthopaedic hardware T84.84XA  PROCEDURE:  Procedure(s): Left ring finger plate removal (Left)  SURGEON: Leitha Schuller, MD  ASSISTANTS: None  ANESTHESIA:   general  EBL:  Total I/O In: 650 [I.V.:500; IV Piggyback:150] Out: -   BLOOD ADMINISTERED:none  DRAINS: none   LOCAL MEDICATIONS USED:  MARCAINE     SPECIMEN:  No Specimen  DISPOSITION OF SPECIMEN:  N/A  COUNTS:  YES  TOURNIQUET:   Total Tourniquet Time Documented: Upper Arm (Left) - 33 minutes Total: Upper Arm (Left) - 33 minutes   IMPLANTS: None  DICTATION: .Dragon Dictation patient was brought to the operating room and after adequate general anesthesia was obtained the left arm was prepped and draped in the usual sterile fashion.  After patient identification and timeout procedure the tourniquet was raised and the prior incision was opened.  Of note was scar tissue had adhered from the skin to the tendon and this was released to allow for better motion.  Next the tendon was split in the midline and the tendon was adherent to the plate as well with a periosteal elevator the tendon was lifted off the plate and the entire plate exposed with screws removed followed by the plate.  Next the finger was placed to range of motion after having released the extensor tendon over the plate and from the skin full motion of the MCP and PIP joints was obtained.  The wound was irrigated and a digital block placed with 10 cc half percent Sensorcaine for postop analgesia.  The wound was closed with a 3-0 Monocryl to repair the split extensor tendon over the proximal phalanx followed by  4-0 nylon in short runs.  Xeroform 4 x 4's and a conformer dressing applied buddy taping essentially the ring and middle fingers to help get the ring finger moving.  Tourniquet is let down before the close the case.  PLAN OF CARE: Discharge to home after PACU  PATIENT DISPOSITION:  PACU - hemodynamically stable.

## 2020-10-19 NOTE — Transfer of Care (Signed)
Immediate Anesthesia Transfer of Care Note  Patient: Debra Shelton  Procedure(s) Performed: Left ring finger plate removal (Left Ring Finger)  Patient Location: PACU  Anesthesia Type:General  Level of Consciousness: drowsy  Airway & Oxygen Therapy: Patient Spontanous Breathing and Patient connected to face mask oxygen  Post-op Assessment: Report given to RN and Post -op Vital signs reviewed and stable  Post vital signs: Reviewed and stable  Last Vitals:  Vitals Value Taken Time  BP 137/86 10/19/20 1240  Temp 36 C 10/19/20 1240  Pulse 85 10/19/20 1242  Resp 11 10/19/20 1242  SpO2 100 % 10/19/20 1242  Vitals shown include unvalidated device data.  Last Pain:  Vitals:   10/19/20 1240  TempSrc:   PainSc: (P) 2          Complications: No complications documented.

## 2020-10-19 NOTE — Anesthesia Preprocedure Evaluation (Signed)
Anesthesia Evaluation  Patient identified by MRN, date of birth, ID band Patient awake    Reviewed: Allergy & Precautions, NPO status , Patient's Chart, lab work & pertinent test results  History of Anesthesia Complications Negative for: history of anesthetic complications  Airway Mallampati: II       Dental   Pulmonary neg sleep apnea, neg COPD, Not current smoker,           Cardiovascular (-) hypertension(-) Past MI and (-) CHF (-) dysrhythmias (-) Valvular Problems/Murmurs     Neuro/Psych neg Seizures    GI/Hepatic Neg liver ROS, neg GERD  ,  Endo/Other  neg diabetes  Renal/GU negative Renal ROS     Musculoskeletal   Abdominal   Peds  Hematology   Anesthesia Other Findings   Reproductive/Obstetrics                             Anesthesia Physical Anesthesia Plan  ASA: I  Anesthesia Plan: General   Post-op Pain Management:    Induction: Intravenous  PONV Risk Score and Plan: 3 and Ondansetron and Dexamethasone  Airway Management Planned: LMA  Additional Equipment:   Intra-op Plan:   Post-operative Plan:   Informed Consent: I have reviewed the patients History and Physical, chart, labs and discussed the procedure including the risks, benefits and alternatives for the proposed anesthesia with the patient or authorized representative who has indicated his/her understanding and acceptance.       Plan Discussed with:   Anesthesia Plan Comments:         Anesthesia Quick Evaluation

## 2020-10-19 NOTE — H&P (Signed)
Chief Complaint  Patient presents with  . Follow-up  S/P ORIF Lt 4th digit 07/29/20    History of the Present Illness: Debra Shelton is a 44 y.o. is a right-hand dominant female for follow-up evaluation status post ORIF of the left ring finger proximal phalanx preformed on 07/29/2020. She has been having a lot of stiffness. X-ray at her last visit showed well aligned proximal phalanx fracture with some signs of healing. She has difficulty getting PIP motion.  I have reviewed past medical, surgical, social and family history, and allergies as documented in the EMR.  Past Medical History: Past Medical History:  Diagnosis Date  . Pneumonia 04/2007  Admitted x 10 days   Past Surgical History: Past Surgical History:  Procedure Laterality Date  . AUGMENTATION MAMMOPLASTY BILATERAL W/PROSTHESIS Bilateral 2003  . INCISIONAL BIOPSY BREAST Left 2003  x two  . ORIF 4th digit/ring proximal phalanx fx Left 07/29/2020  4th finger (Dr. Rosita Kea)  . THORACOTOMY / DECORTICATION PARIETAL PLEURA Left 04/2007   Past Family History: Family History  Problem Relation Age of Onset  . Asthma Father   Medications: Current Outpatient Medications Ordered in Epic  Medication Sig Dispense Refill  . levonorgestreL (MIRENA 52 MG) 20 mcg/24 hr (6 years) IUD Insert 1 each into the uterus once Follow package directions.   No current Epic-ordered facility-administered medications on file.   Allergies: No Known Allergies   Body mass index is 26.13 kg/m.  Review of Systems: A comprehensive 14 point ROS was performed, reviewed, and the pertinent orthopaedic findings are documented in the HPI.  Vitals:  10/08/20 0822  BP: 122/70    General Physical Examination:   General/Constitutional: No apparent distress: well-nourished and well developed. Eyes: Pupils equal, round with synchronous movement. Lungs: Clear to auscultation HEENT: Normal Vascular: No edema, swelling or tenderness, except as noted  in detailed exam. Cardiac: Heart rate and rhythm is regular. Integumentary: No impressive skin lesions present, except as noted in detailed exam. Neuro/Psych: Normal mood and affect, oriented to person, place and time.  Musculoskeletal Examination:  On exam, left ring finger lacks approximately 70 degrees at the PIP joint and 95 degrees at the MCP joint. Scar is adherent. There is stiffness of the left ring finger IP joint with scar tissue. Lungs are clear. Heart rate and rhythm is normal. HEENT is normal.  Radiographs: AP, lateral, and oblique x-rays of the left ring finger were ordered and personally reviewed today. These show fracture line is still visible, but it appears there to have bridging callus. Hardware is intact.  X-ray Impression Healed fracture.  Assessment: ICD-10-CM  1. Status post ORIF left ring finger proximal phalanx S62.615A  2. Painful orthopaedic hardware (CMS-HCC) [phone]   Plan:  The patient has clinical findings of painful hardware of the left ring finger with stiffness of the IP joint with scar tissue.   We discussed the patient's prior x-ray findings. I explained her tendon is stuck to the plate, but we usually like to wait 3 months for the bone to be completely healed. I recommend we remove the plate and make sure the tendon is free. After surgery, she will start range of motion work immediately. I explained the surgery and postoperative course in detail.  We will plan for removal of hardware and tenolysis of the extensor tendon. We will schedule the patient for surgery in the near future.  Surgical Risks:  The nature of the condition and the proposed procedure has been reviewed in detail with  the patient. Surgical versus non-surgical options and prognosis for recovery have been reviewed and the inherent risks and benefits of each have been discussed including the risks of infection, bleeding, injury to nerves/blood vessels/tendons, incomplete relief of  symptoms, persisting pain and/or stiffness, loss of function, complex regional pain syndrome, failure of the procedure, as appropriate.  Teeth: Normal   Attestation: I, Karyn Hoffelt, am documenting for [facility], MD utilizing [employer] DAX.     Electronically signed by Marlena Clipper, MD at 10/08/2020 7:34 PM EDT  Back to top of Progress Notes  Harrel Carina, CMA - 10/08/2020 8:15 AM EDT Formatting of this note might be different from the original. Review of Systems  Constitutional: Negative.  HENT: Negative.  Eyes: Negative.  Respiratory: Negative.  Cardiovascular: Negative.  Gastrointestinal: Negative.  Endocrine: Negative.  Genitourinary: Negative.  Musculoskeletal: Positive for arthralgias.  Skin: Negative.  Allergic/Immunologic: Negative.  Neurological: Positive for weakness.  Hematological: Negative.  Psychiatric/Behavioral: Negative.    Electronically signed by Harrel Carina, CMA at 10/08/2020 7:34 PM EDT   Reviewed  H+P,  No changes noted.

## 2020-10-19 NOTE — Anesthesia Postprocedure Evaluation (Signed)
Anesthesia Post Note  Patient: Debra Shelton  Procedure(s) Performed: Left ring finger plate removal (Left Ring Finger)  Patient location during evaluation: PACU Anesthesia Type: General Level of consciousness: awake and alert Pain management: pain level controlled Vital Signs Assessment: post-procedure vital signs reviewed and stable Respiratory status: spontaneous breathing and respiratory function stable Cardiovascular status: stable Anesthetic complications: no   No complications documented.   Last Vitals:  Vitals:   10/19/20 0933 10/19/20 1240  BP: 121/81 137/86  Pulse: 75 92  Resp: 16 12  Temp: (!) 36.3 C (!) 36 C  SpO2: 99% 100%    Last Pain:  Vitals:   10/19/20 1240  TempSrc:   PainSc: 2                  [place]

## 2020-10-19 NOTE — Anesthesia Procedure Notes (Signed)
Procedure Name: LMA Insertion Date/Time: 10/19/2020 11:40 AM Performed by: Lynden Oxford, CRNA Pre-anesthesia Checklist: Patient identified, Emergency Drugs available, Suction available and Patient being monitored Patient Re-evaluated:Patient Re-evaluated prior to induction Oxygen Delivery Method: Circle system utilized Preoxygenation: Pre-oxygenation with 100% oxygen Induction Type: IV induction Ventilation: Mask ventilation without difficulty LMA: LMA flexible inserted LMA Size: 3.5 Tube type: Oral Number of attempts: 1 Airway Equipment and Method: Oral airway and Patient positioned with wedge pillow Placement Confirmation: positive ETCO2 and breath sounds checked- equal and bilateral Tube secured with: Tape Dental Injury: Teeth and Oropharynx as per pre-operative assessment

## 2020-10-20 ENCOUNTER — Encounter: Payer: Self-pay | Admitting: Orthopedic Surgery

## 2020-10-28 ENCOUNTER — Encounter: Payer: Self-pay | Admitting: Occupational Therapy

## 2020-10-28 ENCOUNTER — Ambulatory Visit: Payer: 59 | Attending: Orthopedic Surgery | Admitting: Occupational Therapy

## 2020-10-28 ENCOUNTER — Other Ambulatory Visit: Payer: Self-pay

## 2020-10-28 DIAGNOSIS — L905 Scar conditions and fibrosis of skin: Secondary | ICD-10-CM | POA: Diagnosis present

## 2020-10-28 DIAGNOSIS — M6281 Muscle weakness (generalized): Secondary | ICD-10-CM | POA: Insufficient documentation

## 2020-10-28 DIAGNOSIS — M25642 Stiffness of left hand, not elsewhere classified: Secondary | ICD-10-CM

## 2020-10-28 DIAGNOSIS — R600 Localized edema: Secondary | ICD-10-CM | POA: Insufficient documentation

## 2020-10-28 NOTE — Therapy (Signed)
[facility] PHYS[facility] AND SPORTS MEDICINE [address] [place], [address] Phone: [phone]   Fax:  [phone]  Occupational Therapy Treatment  Patient Details  Name: Debra Shelton MRN: [id number] Date of Birth: 16-Nov-1976 Referring Provider (OT): Cranston Neighbor   Encounter Date: 10/28/2020   OT End of Session - 10/28/20 1103    Visit Number 10    Number of Visits 18    Date for OT Re-Evaluation 12/09/20    OT Start Time 1020    OT Stop Time 1056    OT Time Calculation (min) 36 min    Activity Tolerance Patient tolerated treatment well    Behavior During Therapy [facility] for tasks assessed/performed           Past Medical History:  Diagnosis Date   Pneumonia     Past Surgical History:  Procedure Laterality Date    AUGMENTATION MAMMOPLASTY BILATERAL W/PROSTHESIS Bilateral      AUGMENTATION MAMMAPLASTY     CHEST TUBE INSERTION Left 05/2008   FRACTURE SURGERY     HARDWARE REMOVAL Left 10/19/2020   Procedure: Left ring finger plate removal;  Surgeon: Kennedy Bucker, MD;  Location: [facility] ORS;  Service: Orthopedics;  Laterality: Left;   INCISIONAL BIOPSY BREAST Left 2003      OPEN REDUCTION INTERNAL FIXATION (ORIF) PROXIMAL PHALANX Left 07/29/2020   Procedure: OPEN REDUCTION INTERNAL FIXATION (ORIF) OF LEFT 4TH DIGIT  PROXIMAL PHALANX FRACTURE;  Surgeon: Kennedy Bucker, MD;  Location: [facility] ORS;  Service: Orthopedics;  Laterality: Left;   THORACOTOMY / DECORTICATION PARIETAL PLEURA Left 04/2007       There were no vitals filed for this visit.   Subjective Assessment - 10/28/20 1057    Subjective  I seen Dr Rosita Kea last month -and then had my hardware in my finger removed 10/19/2020 - seening Thayer Ohm tomorrow to get stitches out - did bust one of the stitches some time - can't remember    Pertinent History Pt had fx of L 4th digit prox phalanges on 07/25/2020- ended up with ORIF on 07/29/2020 - and seen by this OT - until middle Oct - hard  ware in 4th digit removed 10/19/20 - and refer to cont OT again    Patient Stated Goals Want to be able to  use my hand like before - make fist and be able to grip , hold objects, play volleyball , lift weights, housewrok    Currently in Pain? No/denies              [facility] OT Assessment - 10/28/20 0001      Left Hand AROM   L Ring  MCP 0-90 100 Degrees   extention WNL    L Ring PIP 0-100 75 Degrees   in session 85   L Ring DIP 0-70 55 Degrees           pt arrive with stitches still in place -appear she busted one -and wants to pull open or gab - did apply sterri strip   Extention of 4th MC - WNL - and able to tap digit - still some scar tissue proximal - but AROM WNL          OT Treatments/Exercises (OP) - 10/28/20 0001      Moist Heat Therapy   Number Minutes Moist Heat 6 Minutes    Moist Heat Location Hand   prior to AROM          tendon glides AROM done  After heat MC 90  PIP blocked  AROM (reinforce for pt to block - and focus on PIP /DIP flexion) 10 reps  Pen in hand to block MC at 90 to put leverage to PIP during composite fist - pt able to touch palm this date - first 2 had to do place and hold -but afterwards  AROM touch palm - did over flex at [facility] - and PIP increase to 85   Pt to get stitches out tomorrow -cont HEP - 5 x day - heat prior          OT Education - 10/28/20 1102    Education Details new measurements after last surgery- update of HEP    Person(s) Educated Patient    Methods Explanation;Demonstration;Verbal cues;Handout;Tactile cues    Comprehension Returned demonstration;Verbal cues required;Verbalized understanding            OT Short Term Goals - 10/28/20 1109      OT SHORT TERM GOAL #1   Title Pt to be independent in HEP to decrease scar tissue and increase AROM to 4th digit to grasp utencils , brush , deodorant    Baseline had surgrery again - hard ware removed - new HEP ed on    Time 3    Period Weeks    Status New     Target Date 11/18/20             OT Long Term Goals - 10/28/20 1109      OT LONG TERM GOAL #1   Title L 4th digit PIP flexion , composite flexion to be able to touch palm to hold jewelry and money    Baseline MC flexion 85, PIP 80 and DIP 55 - lost some DIP flexion and maintain PIP AROM since 2nd surgery    Time 4    Period Weeks    Status New    Target Date 11/25/20      OT LONG TERM GOAL #2   Title L grip strength increase to more than 50% compare to R to carry groceries and more than 10 lbs without symptoms    Baseline 9 days s/p hard ware removal - no strengthening yet- stitches still in    Time 6    Period Weeks    Status New    Target Date 12/09/20      OT LONG TERM GOAL #3   Title Function score on PRWHE improve to less than 5/50 to do weights, house work , Location manager    Baseline did some weight -but resting more on palm per pt , no teaching volleyball and house work since 2nd surgery    Time 6    Period Weeks    Status New    Target Date 12/09/20                 Plan - 10/28/20 1105    Clinical Impression Statement Pt present today 9 days out from hardware removal at L 4th Proximal phalanges after she had 07/29/20 ORIF of proximal phalanges after fx - pt this date with increase AROM in L 4th digit- able to touch palm after some heat - stitches still in place and pt report that she busted one -but don't know when it happend - did apply sterri strip incase - pt to focus on AROM MC at 90 , and PIP flexion followed by composite fist - extention of MC WNL now compare to prior to surgery    OT  Occupational Profile and History Problem Focused Assessment - Including review of records relating to presenting problem    Occupational performance deficits (Please refer to evaluation for details): ADL's;IADL's;Work;Play;Leisure;Social Participation    Body Structure / Function / Physical Skills ADL;Edema;Dexterity;Decreased knowledge of precautions;Flexibility;ROM;UE  functional use;Scar mobility;IADL;Pain;Strength    Rehab Potential Good    Clinical Decision Making Limited treatment options, no task modification necessary    Comorbidities Affecting Occupational Performance: None    Modification or Assistance to Complete Evaluation  No modification of tasks or assist necessary to complete eval    OT Frequency --   2 x wk for scar and ROM , decrease later to 1 x wk - strengthening   OT Duration 6 weeks    OT Treatment/Interventions Self-care/ADL training;Moist Heat;Paraffin;Fluidtherapy;Contrast Bath;Manual Therapy;Passive range of motion;Scar mobilization;Splinting;Patient/family education;Therapeutic exercise    Plan assess progress and upgrade as needed    OT Home Exercise Plan see pt instruction    Consulted and Agree with Plan of Care Patient           Patient will benefit from skilled therapeutic intervention in order to improve the following deficits and impairments:   Body Structure / Function / Physical Skills: ADL, Edema, Dexterity, Decreased knowledge of precautions, Flexibility, ROM, UE functional use, Scar mobility, IADL, Pain, Strength       Visit Diagnosis: Stiffness of left hand, not elsewhere classified - Plan: Ot plan of care cert/re-cert  Scar condition and fibrosis of skin - Plan: Ot plan of care cert/re-cert  Muscle weakness (generalized) - Plan: Ot plan of care cert/re-cert  Localized edema - Plan: Ot plan of care cert/re-cert    Problem List There are no problems to display for this patient.   Oletta Cohn OTR/L,CLT 10/28/2020, 11:22 AM  [facility] PHYSICAL AND SPORTS MEDICINE [address] [place], [address] Phone: [phone]   Fax:  [phone]  Name: AZUCENA DART MRN: [id number] Date of Birth: 04/10/1976

## 2020-10-28 NOTE — Patient Instructions (Signed)
Pt to do every 2 hrs Use some heat prior  AROM   - tendon glides - MC flexion  Blocked intrinsic fist  Composite fist - touching palm - block MC at 90 with pen in hand at [facility]  10 reps

## 2020-11-02 ENCOUNTER — Ambulatory Visit: Payer: 59 | Admitting: Occupational Therapy

## 2020-11-04 ENCOUNTER — Ambulatory Visit: Payer: 59 | Admitting: Occupational Therapy

## 2024-12-16 ENCOUNTER — Encounter: Payer: Self-pay | Admitting: Plastic Surgery

## 2024-12-22 DIAGNOSIS — N6311 Unspecified lump in the right breast, upper outer quadrant: Secondary | ICD-10-CM

## 2024-12-26 ENCOUNTER — Encounter: Payer: Self-pay | Admitting: Obstetrics and Gynecology

## 2024-12-26 ENCOUNTER — Ambulatory Visit
Admission: RE | Admit: 2024-12-26 | Discharge: 2024-12-26 | Disposition: A | Source: Ambulatory Visit | Attending: Obstetrics and Gynecology | Admitting: Obstetrics and Gynecology

## 2024-12-26 ENCOUNTER — Other Ambulatory Visit: Payer: Self-pay | Admitting: Obstetrics and Gynecology

## 2024-12-26 DIAGNOSIS — Z1239 Encounter for other screening for malignant neoplasm of breast: Secondary | ICD-10-CM | POA: Diagnosis not present

## 2024-12-26 DIAGNOSIS — R928 Other abnormal and inconclusive findings on diagnostic imaging of breast: Secondary | ICD-10-CM

## 2024-12-26 DIAGNOSIS — D0510 Intraductal carcinoma in situ of unspecified breast: Secondary | ICD-10-CM | POA: Insufficient documentation

## 2024-12-26 DIAGNOSIS — N6002 Solitary cyst of left breast: Secondary | ICD-10-CM | POA: Insufficient documentation

## 2024-12-26 DIAGNOSIS — N6311 Unspecified lump in the right breast, upper outer quadrant: Secondary | ICD-10-CM | POA: Diagnosis present

## 2024-12-26 HISTORY — PX: BREAST BIOPSY: SHX20

## 2024-12-26 MED ORDER — LIDOCAINE-EPINEPHRINE 1 %-1:100000 IJ SOLN
10.0000 mL | Freq: Once | INTRAMUSCULAR | Status: AC
  Administered 2024-12-26: 10 mL

## 2024-12-26 MED ORDER — LIDOCAINE 1 % OPTIME INJ - NO CHARGE
5.0000 mL | Freq: Once | INTRAMUSCULAR | Status: AC
  Administered 2024-12-26: 5 mL
  Filled 2024-12-26: qty 6

## 2024-12-29 ENCOUNTER — Encounter: Payer: Self-pay | Admitting: *Deleted

## 2024-12-29 DIAGNOSIS — C50919 Malignant neoplasm of unspecified site of unspecified female breast: Secondary | ICD-10-CM

## 2024-12-29 LAB — SURGICAL PATHOLOGY

## 2024-12-29 NOTE — Progress Notes (Signed)
 Received referral for newly diagnosed breast cancer from [facility].  Navigation initiated.  She will see Dr. Melanee on 1/14 at 1:30 and Dr. Cesar Thursday 1/15 at 8:00.

## 2024-12-31 ENCOUNTER — Other Ambulatory Visit: Payer: Self-pay | Admitting: Licensed Clinical Social Worker

## 2024-12-31 ENCOUNTER — Inpatient Hospital Stay: Attending: Oncology | Admitting: Oncology

## 2024-12-31 ENCOUNTER — Encounter: Payer: Self-pay | Admitting: *Deleted

## 2024-12-31 ENCOUNTER — Encounter: Payer: Self-pay | Admitting: Oncology

## 2024-12-31 ENCOUNTER — Inpatient Hospital Stay: Admitting: Licensed Clinical Social Worker

## 2024-12-31 ENCOUNTER — Inpatient Hospital Stay

## 2024-12-31 ENCOUNTER — Encounter: Payer: Self-pay | Admitting: Licensed Clinical Social Worker

## 2024-12-31 VITALS — BP 109/88 | HR 79 | Temp 99.5°F | Resp 18 | Ht 64.0 in | Wt 179.0 lb

## 2024-12-31 DIAGNOSIS — C50919 Malignant neoplasm of unspecified site of unspecified female breast: Secondary | ICD-10-CM

## 2024-12-31 DIAGNOSIS — C773 Secondary and unspecified malignant neoplasm of axilla and upper limb lymph nodes: Secondary | ICD-10-CM | POA: Insufficient documentation

## 2024-12-31 DIAGNOSIS — C50811 Malignant neoplasm of overlapping sites of right female breast: Secondary | ICD-10-CM | POA: Diagnosis not present

## 2024-12-31 DIAGNOSIS — Z5111 Encounter for antineoplastic chemotherapy: Secondary | ICD-10-CM | POA: Diagnosis present

## 2024-12-31 DIAGNOSIS — Z8042 Family history of malignant neoplasm of prostate: Secondary | ICD-10-CM

## 2024-12-31 DIAGNOSIS — Z1379 Encounter for other screening for genetic and chromosomal anomalies: Secondary | ICD-10-CM

## 2024-12-31 DIAGNOSIS — Z1721 Progesterone receptor positive status: Secondary | ICD-10-CM | POA: Diagnosis not present

## 2024-12-31 DIAGNOSIS — Z1732 Human epidermal growth factor receptor 2 negative status: Secondary | ICD-10-CM | POA: Diagnosis not present

## 2024-12-31 DIAGNOSIS — Z170 Estrogen receptor positive status [ER+]: Secondary | ICD-10-CM | POA: Insufficient documentation

## 2024-12-31 DIAGNOSIS — Z7189 Other specified counseling: Secondary | ICD-10-CM

## 2024-12-31 DIAGNOSIS — Z9012 Acquired absence of left breast and nipple: Secondary | ICD-10-CM | POA: Diagnosis not present

## 2024-12-31 LAB — GENETIC SCREENING ORDER

## 2024-12-31 NOTE — Progress Notes (Signed)
 New patient; Breast Cancer referred by [facility].

## 2024-12-31 NOTE — Progress Notes (Signed)
 REFERRING PROVIDER: Melanee Annah BROCKS, MD [address] [place],  [place] [address]  PRIMARY PROVIDER:  Orlando Crank Pi'Ilani, FNP  PRIMARY REASON FOR VISIT:  1. Invasive carcinoma of breast (HCC)   2. Family history of prostate cancer      HISTORY OF PRESENT ILLNESS:   Debra Shelton, a 49 y.o. female, was seen for a [facility] cancer genetics consultation at the request of Dr. Melanee due to a personal and family history of cancer  Debra Shelton presents to clinic today to discuss the possibility of a hereditary predisposition to cancer, genetic testing, and to further clarify her future cancer risks, as well as potential cancer risks for family members.   CANCER HISTORY:  In 2026, at the age of 28, Debra Shelton was diagnosed with invasive ductal carcinoma of the right breast, ER/PR+ HER2-. The treatment plan currently includes neoadjuvant chemotherapy.   RELEVANT MEDICAL HISTORY:  Menarche was at age 36.  First live birth at age 54.  Ovaries intact: yes.  Hysterectomy: no.  Colonoscopy: no; not examined. Number of breast biopsies: 2.(Previous left lumpectomy for cyst)  Past Medical History:  Diagnosis Date   Breast cancer (HCC)    Pneumonia     Past Surgical History:  Procedure Laterality Date    AUGMENTATION MAMMOPLASTY BILATERAL W/PROSTHESIS Bilateral      AUGMENTATION MAMMAPLASTY Bilateral 2003   BREAST BIOPSY Right 12/26/2024   US  RT BREAST BX W LOC DEV 1ST LESION IMG BX SPEC US  GUIDE 12/26/2024 [facility]   CHEST TUBE INSERTION Left 05/2008   FRACTURE SURGERY     HARDWARE REMOVAL Left 10/19/2020   Procedure: Left ring finger plate removal;  Surgeon: Kathlynn Sharper, MD;  Location: [facility] ORS;  Service: Orthopedics;  Laterality: Left;   INCISIONAL BIOPSY BREAST Left 2003      OPEN REDUCTION INTERNAL FIXATION (ORIF) PROXIMAL PHALANX Left 07/29/2020   Procedure: OPEN REDUCTION INTERNAL FIXATION (ORIF) OF LEFT 4TH DIGIT  PROXIMAL PHALANX FRACTURE;  Surgeon: Kathlynn Sharper, MD;   Location: [facility] ORS;  Service: Orthopedics;  Laterality: Left;   THORACOTOMY / DECORTICATION PARIETAL PLEURA Left 04/2007       FAMILY HISTORY:  We obtained a detailed, 4-generation family history.  Significant diagnoses are listed below: Family History  Problem Relation Age of Onset   Healthy Mother    Prostate cancer Father    Healthy Brother    Healthy Brother    Breast cancer Neg Hx      Father - prostate cancer (treated with radiation) Maternal grandfather - prostate cancer (treated with surgery)  Ms. Tooley is unaware of previous family history of genetic testing for hereditary cancer risks. There is no reported Ashkenazi Jewish ancestry. There is no known consanguinity.  GENETIC COUNSELING ASSESSMENT: Debra Shelton is a 49 y.o. female with a personal history of breast cancer and family history of prostate cancer which is somewhat suggestive of a hereditary cancer syndrome and predisposition to cancer. We, therefore, discussed and recommended the following at today's visit.   DISCUSSION: We discussed that, in general, most cancer is not inherited in families, but instead is sporadic or familial. Sporadic cancers occur by chance and typically happen at older ages (>50 years) . We discussed that approximately 10% of breast/prostate cancer is hereditary. Most cases of hereditary breast/prostate cancer are associated with BRCA1/BRCA2 genes, although there are other genes associated with hereditary cancer as well. Cancers and risks are gene specific. We discussed that testing is beneficial for several reasons including knowing about  cancer risks, identifying potential screening and risk-reduction options that may be appropriate, and to understand if other family members could be at risk for cancer and allow them to undergo genetic testing.   We reviewed the characteristics, features and inheritance patterns of hereditary cancer syndromes. We also discussed genetic testing, including the appropriate  family members to test, the process of testing, insurance coverage and turn-around-time for results. We discussed the implications of a negative, positive and/or variant of uncertain significant result. We recommended Ms. Lundstrom pursue genetic testing for the [facility] CancerNext+RNA gene panel.   The [facility] CancerNext+RNAinsight Panel includes sequencing, rearrangement analysis, and RNA analysis for the following 40 genes: APC, ATM, BAP1, BARD1, BMPR1A, BRCA1, BRCA2, BRIP1, CDH1, CDKN2A, CHEK2, FH, FLCN, MET, MLH1, MSH2, MSH6, MUTYH, NF1, NTHL1, PALB2, PMS2, PTEN, RAD51C, RAD51D, RPS20, SMAD4, STK11, TP53, TSC1, TSC2, and VHL (sequencing and deletion/duplication); AXIN2, HOXB13, MBD4, MSH3, POLD1 and POLE (sequencing only); EPCAM and GREM1 (deletion/duplication only).  Based on Ms. Madrigal's personal history of cancer, she meets medical criteria for genetic testing. Despite that she meets criteria, she may still have an out of pocket cost.   PLAN: After considering the risks, benefits, and limitations, Ms. Bramel provided informed consent to pursue genetic testing and the blood sample was sent to [facility] for analysis of the CancerNext+RNA Panel. Results should be available within approximately 2-3 weeks' time, at which point they will be disclosed by telephone to Ms. Horseman, as will any additional recommendations warranted by these results. Ms. Rovner will receive a summary of her genetic counseling visit and a copy of her results once available. This information will also be available in Epic.   Ms. Croak questions were answered to her satisfaction today. Our contact information was provided should additional questions or concerns arise. Thank you for the referral and allowing us  to share in the care of your patient.   Dena Cary, MS, [facility] Genetic Counselor [email or web address] Phone: [phone]  I personally spent a total of 40 minutes in the care of the patient today including  getting/reviewing separately obtained history, counseling and educating, placing orders, and documenting clinical information in the EHR. Dr. Delinda was available for discussion regarding this case.   _______________________________________________________________________ For Office Staff:  Number of people involved in session: 2 - patient's mother was present.  Was an Intern/ student involved with case: no

## 2024-12-31 NOTE — Progress Notes (Signed)
 "  Hematology/Oncology Consult note [facility] Telephone:([phone] [phone]  Patient Care Team: Orlando Comer Hoot, FNP as PCP - General (Obstetrics and Gynecology) Melanee Annah BROCKS, MD as Consulting Physician (Oncology)   Name of the patient: Debra Shelton  [id number]  Dec 15, 1976    Reason for referral-new diagnosis of breast cancer   Referring physician-Katherine Orlando, FNP  Date of visit: 12/31/2024   History of presenting illness- Patient is a 49 year old female who underwent a bilateral diagnostic mammogram for palpable right breast lump and pain in her right arm.  Patient has noted induration around her breast implants and right breast in particular over the last 2 years.  She had undergone bilateral breast implants in the past.  Mammogram showed diffuse left breast calcifications without suspicious focal group.  There was diffuse right breast skin thickening and asymmetric decrease in the size of the right breast.  In the upper outer quadrant of the right breast spiculated irregular mass along the implant capsule at the 9 o'clock position 5 cm from the nipple which measured 4 mm in size.  At the site of palpable abnormality in the right breast at 10 o'clock position 6 cm from the nipple there was an irregular spiculated mass measuring 4.7 x 1.7 x 4 cm along the implant capsule.  Suggestion of multiple additional masses along the margins of the implant throughout the right breast measuring 1 cm and 1.7 cm respectively.  In the right axilla there is an irregular hypoechoic mass measuring 5 x 6 x 6 mm.  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  Lymph node biopsy was also positive for invasive mammary carcinoma.  Tumor cells were estrogen +85% moderate strong intensity, PR 100% positive strong staining intensity Ki-67 15% and HER2 negative.  She has a  hormone-based intrauterine device in place for seven years and has recently experienced intermittent spotting without regular menses. She is scheduled for IUD removal in early February and is premenopausal by history, with laboratory confirmation pending. She has no plans for future pregnancies and is aware of the potential impact of chemotherapy on fertility. She denies systemic symptoms such as fever, weight loss, or night sweats.  During the visit, she expressed significant concern regarding chemotherapy, requesting detailed discussion of risks, benefits, and alternatives.  Prior history of left lung abscess in 2009 due to Peptostreptococcus infection requiring chest tube placement.    ECOG PS- 0  Pain scale- 0   Review of systems- Review of Systems  Constitutional:  Negative for chills, fever, malaise/fatigue and weight loss.  HENT:  Negative for congestion, ear discharge and nosebleeds.   Eyes:  Negative for blurred vision.  Respiratory:  Negative for cough, hemoptysis, sputum production, shortness of breath and wheezing.   Cardiovascular:  Negative for chest pain, palpitations, orthopnea and claudication.  Gastrointestinal:  Negative for abdominal pain, blood in stool, constipation, diarrhea, heartburn, melena, nausea and vomiting.  Genitourinary:  Negative for dysuria, flank pain, frequency, hematuria and urgency.  Musculoskeletal:  Negative for back pain, joint pain and myalgias.  Skin:  Negative for rash.  Neurological:  Negative for dizziness, tingling, focal weakness, seizures, weakness and headaches.  Endo/Heme/Allergies:  Does not bruise/bleed easily.  Psychiatric/Behavioral:  Negative for depression and suicidal ideas. The patient does not have insomnia.     Allergies[1]  There are no active problems to display for this patient.    Past Medical History:  Diagnosis Date   Breast cancer (HCC)    Pneumonia      Past Surgical History:  Procedure Laterality Date     AUGMENTATION MAMMOPLASTY BILATERAL W/PROSTHESIS Bilateral      AUGMENTATION MAMMAPLASTY Bilateral 2003   BREAST BIOPSY Right 12/26/2024   US  RT BREAST BX W LOC DEV 1ST LESION IMG BX SPEC US  GUIDE 12/26/2024 [facility]   CHEST TUBE INSERTION Left 05/2008   FRACTURE SURGERY     HARDWARE REMOVAL Left 10/19/2020   Procedure: Left ring finger plate removal;  Surgeon: Kathlynn Sharper, MD;  Location: [facility] OR[facility];  Service: Orthopedics;  Laterality: Left;   INCISIONAL BIOPSY BREAST Left 2003      OPEN REDUCTION INTERNAL FIXATION (ORIF) PROXIMAL PHALANX Left 07/29/2020   Procedure: OPEN REDUCTION INTERNAL FIXATION (ORIF) OF LEFT 4TH DIGIT  PROXIMAL PHALANX FRACTURE;  Surgeon: Kathlynn Sharper, MD;  Location: [facility] OR[facility];  Service: Orthopedics;  Laterality: Left;   THORACOTOMY / DECORTICATION PARIETAL PLEURA Left 04/2007       Social History   Socioeconomic History   Marital status: Single    Spouse name: Not on file   Number of children: 1   Years of education: Not on file   Highest education level: Not on file  Occupational History   Not on file  Tobacco Use   Smoking status: Never   Smokeless tobacco: Never  Vaping Use   Vaping status: Never Used  Substance and Sexual Activity   Alcohol use: Yes    Alcohol/week: 21.0 standard drinks of alcohol    Types: 21 Glasses of wine per week    Comment: social drinker   Drug use: Never   Sexual activity: Not Currently  Other Topics Concern   Not on file  Social History Narrative   Not on file   Social Drivers of Health   Tobacco Use: Low Risk (12/26/2024)   Patient History    Smoking Tobacco Use: Never    Smokeless Tobacco Use: Never    Passive Exposure: Not on file  Financial Resource Strain: Low Risk  (12/22/2024)   Received from [facility]   Overall Financial Resource Strain (CARDIA)    Difficulty of Paying Living Expenses: Not hard at all  Food Insecurity: No Food Insecurity (12/31/2024)   Epic    Worried About Radiation Protection Practitioner  of Food in the Last Year: Never true    Ran Out of Food in the Last Year: Never true  Transportation Needs: No Transportation Needs (12/31/2024)   Epic    Lack of Transportation (Medical): No    Lack of Transportation (Non-Medical): No  Physical Activity: Not on file  Stress: Not on file  Social Connections: Not on file  Intimate Partner Violence: Not At Risk (12/31/2024)   Epic    Fear of Current or Ex-Partner: No    Emotionally Abused: No    Physically Abused: No    Sexually Abused: No  Depression (PHQ2-9): Low Risk (12/31/2024)   Depression (PHQ2-9)    PHQ-2 Score: 0  Alcohol Screen: Not on file  Housing: Low Risk (12/31/2024)   Epic    Unable to Pay for Housing in the Last Year: No    Number of Times Moved in the Last Year: 0    Homeless in the Last Year: No  Utilities: Not At Risk (12/31/2024)   Epic    Threatened with loss of utilities: No  Health Literacy: Not on file     Family History  Problem Relation  Age of Onset   Healthy Mother    Prostate cancer Father    Healthy Brother    Healthy Brother    Breast cancer Neg Hx     Current Medications[2]   Physical exam:  Vitals:   12/31/24 1344  BP: 109/88  Pulse: 79  Resp: 18  Temp: 99.5 F (37.5 C)  TempSrc: Tympanic  Weight: 179 lb (81.2 kg)  Height: 5' 4 (1.626 m)   Physical Exam Cardiovascular:     Rate and Rhythm: Normal rate and regular rhythm.     Heart sounds: Normal heart sounds.  Pulmonary:     Effort: Pulmonary effort is normal.     Breath sounds: Normal breath sounds.  Abdominal:     General: Bowel sounds are normal.     Palpations: Abdomen is soft.  Skin:    General: Skin is warm and dry.  Neurological:     Mental Status: She is alert and oriented to person, place, and time.   Breast exam: Right breast is diffusely hard to palpation and shrunken as compared to the left breast.  It is hard to palpate 1 distinct mass as the breast in general is diffusely hard.  There is palpable right  axillary adenopathy.  No palpable masses in the left breast.  Palpable implant.  No real nipple retraction noted on the left breast and no palpable left axillary adenopathy        No data to display             No data to display          No images are attached to the encounter.  US  AXILLARY NODE CORE BIOPSY RIGHT Addendum Date: 12/30/2024 ADDENDUM REPORT: 12/30/2024 07:33 ADDENDUM: PATHOLOGY revealed: Site 1. Breast, right, needle core biopsy, 10:00 6 cmfn 4.7cm (ribbon clip)- INVASIVE DUCTAL CARCINOMA, SEE NOTE DUCTAL CARCINOMA IN SITU, COMEDO, INTERMEDIATE NUCLEAR GRADE, WITH NECROSIS- OVERALL GRADE: 2- LYMPHOVASCULAR INVASION: PRESENT- CANCER LENGTH: 1.5 CM- CALCIFICATIONS: PRESENT IN DCIS Pathology results are CONCORDANT with imaging findings, per Dr. Norleen Croak. PATHOLOGY revealed: Site 2. Lymph node, needle/core biopsy, Right axillary node/mass 6mm (hydromark coil clip)- INVASIVE DUCTAL CARCINOMA, GRADE 2 INVOLVING ADIPOSE TISSUE Pathology results are CONCORDANT with imaging findings, per Dr. Norleen Croak. Pathology results and recommendations below were discussed with patient by telephone. Patient reported biopsy site within normal limits with slight tenderness at the site. Post biopsy care instructions were reviewed, questions were answered and my direct phone number was provided to patient. Patient was instructed to call [facility] if any concerns or questions arise related to the biopsy. RECOMMENDATIONS: 1. Surgical and oncological consultation. Request for surgical and oncological consultation was relayed to Shasta Ada, Nurse Navigator at [facility]. 2. Skin punch biopsy recommended for suspected skin involvement. 3. Recommend pretreatment MRI to determine extent of disease given patient breast density and age. Pathology results reported by Mliss CHARM Molt RN 12/29/2024. Electronically Signed   By: Norleen Croak M.D.   On: 12/30/2024 07:33   Result Date:  12/30/2024 CLINICAL DATA:  BI-RADS 5 mass in the LATERAL RIGHT breast. Multiple abnormal masses versus lymph nodes in the RIGHT axilla EXAM: ULTRASOUND GUIDED RIGHT BREAST CORE NEEDLE BIOPSY; US  AXILLARY NODE CORE BIOPSY RIGHT COMPARISON:  Previous exam(s). PROCEDURE: I met with the patient and we discussed the procedure of ultrasound-guided biopsy, including benefits and alternatives. We discussed the high likelihood of a successful procedure. We discussed the risks of the procedure, including infection,  bleeding, implant rupture, tissue injury, clip migration, and inadequate sampling. Informed written consent was given. The usual time-out protocol was performed immediately prior to the procedure. Site 1: RIGHT breast 10 o'clock 6 cm from the nipple Lesion quadrant: Upper-outer Using sterile technique and 1% lidocaine  and 1% lidocaine  with epinephrine  as local anesthetic, under direct ultrasound visualization, a 12 gauge spring-loaded device was used to perform biopsy of the RIGHT breast mass at 10 o'clock 6 cm from the nipple using a LATERAL approach. At the conclusion of the procedure ribbon-shaped tissue marker clip was deployed into the biopsy cavity. Site 2: RIGHT axillary mass Using sterile technique and 1% lidocaine  and 1% lidocaine  with epinephrine  as local anesthetic, under direct ultrasound visualization, a 14 gauge spring-loaded device was used to perform biopsy of a RIGHT axillary mass using a LATERAL approach. At the conclusion of the procedure [facility] coil-shaped tissue marker clip was deployed into the biopsy cavity. Follow up 2 view mammogram was performed and dictated separately. IMPRESSION: Ultrasound guided biopsy of a RIGHT breast mass at 10 o'clock and a RIGHT axillary mass. No apparent complications. Electronically Signed: By: Norleen Croak M.D. On: 12/26/2024 14:48   US  RT BREAST BX W LOC DEV 1ST LESION IMG BX SPEC US  GUIDE Addendum Date: 12/30/2024 ADDENDUM REPORT: 12/30/2024 07:33  ADDENDUM: PATHOLOGY revealed: Site 1. Breast, right, needle core biopsy, 10:00 6 cmfn 4.7cm (ribbon clip)- INVASIVE DUCTAL CARCINOMA, SEE NOTE DUCTAL CARCINOMA IN SITU, COMEDO, INTERMEDIATE NUCLEAR GRADE, WITH NECROSIS- OVERALL GRADE: 2- LYMPHOVASCULAR INVASION: PRESENT- CANCER LENGTH: 1.5 CM- CALCIFICATIONS: PRESENT IN DCIS Pathology results are CONCORDANT with imaging findings, per Dr. Norleen Croak. PATHOLOGY revealed: Site 2. Lymph node, needle/core biopsy, Right axillary node/mass 6mm (hydromark coil clip)- INVASIVE DUCTAL CARCINOMA, GRADE 2 INVOLVING ADIPOSE TISSUE Pathology results are CONCORDANT with imaging findings, per Dr. Norleen Croak. Pathology results and recommendations below were discussed with patient by telephone. Patient reported biopsy site within normal limits with slight tenderness at the site. Post biopsy care instructions were reviewed, questions were answered and my direct phone number was provided to patient. Patient was instructed to call [facility] if any concerns or questions arise related to the biopsy. RECOMMENDATIONS: 1. Surgical and oncological consultation. Request for surgical and oncological consultation was relayed to Shasta Ada, Nurse Navigator at [facility]. 2. Skin punch biopsy recommended for suspected skin involvement. 3. Recommend pretreatment MRI to determine extent of disease given patient breast density and age. Pathology results reported by Mliss CHARM Molt RN 12/29/2024. Electronically Signed   By: Norleen Croak M.D.   On: 12/30/2024 07:33   Result Date: 12/30/2024 CLINICAL DATA:  BI-RADS 5 mass in the LATERAL RIGHT breast. Multiple abnormal masses versus lymph nodes in the RIGHT axilla EXAM: ULTRASOUND GUIDED RIGHT BREAST CORE NEEDLE BIOPSY; US  AXILLARY NODE CORE BIOPSY RIGHT COMPARISON:  Previous exam(s). PROCEDURE: I met with the patient and we discussed the procedure of ultrasound-guided biopsy, including benefits and alternatives. We  discussed the high likelihood of a successful procedure. We discussed the risks of the procedure, including infection, bleeding, implant rupture, tissue injury, clip migration, and inadequate sampling. Informed written consent was given. The usual time-out protocol was performed immediately prior to the procedure. Site 1: RIGHT breast 10 o'clock 6 cm from the nipple Lesion quadrant: Upper-outer Using sterile technique and 1% lidocaine  and 1% lidocaine  with epinephrine  as local anesthetic, under direct ultrasound visualization, a 12 gauge spring-loaded device was used to perform biopsy of the RIGHT breast mass at  10 o'clock 6 cm from the nipple using a LATERAL approach. At the conclusion of the procedure ribbon-shaped tissue marker clip was deployed into the biopsy cavity. Site 2: RIGHT axillary mass Using sterile technique and 1% lidocaine  and 1% lidocaine  with epinephrine  as local anesthetic, under direct ultrasound visualization, a 14 gauge spring-loaded device was used to perform biopsy of a RIGHT axillary mass using a LATERAL approach. At the conclusion of the procedure [facility] coil-shaped tissue marker clip was deployed into the biopsy cavity. Follow up 2 view mammogram was performed and dictated separately. IMPRESSION: Ultrasound guided biopsy of a RIGHT breast mass at 10 o'clock and a RIGHT axillary mass. No apparent complications. Electronically Signed: By: Norleen Croak M.D. On: 12/26/2024 14:48   MM CLIP PLACEMENT RIGHT Result Date: 12/26/2024 CLINICAL DATA:  Status post RIGHT breast ultrasound biopsy x2 EXAM: 3D DIAGNOSTIC RIGHT MAMMOGRAM POST ULTRASOUND BIOPSY COMPARISON:  Previous exam(s). ACR Breast Density Category c: The breasts are heterogeneously dense, which may obscure small masses. FINDINGS: A prepectoral implant is present and is intact. 3D Mammographic images were obtained following ultrasound guided biopsy of the RIGHT breast mass at 10 o'clock and a RIGHT axillary mass. Appropriate  positioning of the ribbon-shaped biopsy marking clip at the site of biopsy in the RIGHT breast mass at 10 o'clock. Appropriate positioning of the HydroMARK coil-shaped biopsy marking clip at the site of biopsy in the RIGHT axillary mass. IMPRESSION: Appropriate positioning of the biopsy marking clips at the sites of biopsy in the RIGHT breast and axilla. Final Assessment: Post Procedure Mammograms for Marker Placement Electronically Signed   By: Norleen Croak M.D.   On: 12/26/2024 14:49   MM 3D DIAGNOSTIC MAMMOGRAM BILATERAL BREAST W/IMPLANT Result Date: 12/26/2024 CLINICAL DATA:  RIGHT breast palpable lump at 10-11 o'clock x1 year (perceived to be increasing in size). Has heaviness in the RIGHT arm with radiating pain from the LATERAL to MEDIAL superior breast. Also with RIGHT breast pea-sized lump at 9 o'clock x3 months. EXAM: DIGITAL DIAGNOSTIC BILATERAL MAMMOGRAM WITH IMPLANTS, TOMOSYNTHESIS AND CAD; ULTRASOUND RIGHT BREAST LIMITED; ULTRASOUND LEFT BREAST LIMITED TECHNIQUE: Bilateral digital diagnostic mammography and breast tomosynthesis was performed. Standard and/or implant displaced views were performed. The images were evaluated with computer-aided detection. ; Targeted ultrasound examination of the right breast was performed; Targeted ultrasound examination of the left breast was performed. COMPARISON:  Previous exam(s). ACR Breast Density Category c: The breasts are heterogeneously dense, which may obscure small masses. FINDINGS: The patient has prepectoral implants. Postsurgical changes of LEFT breast excisional biopsy. Diffuse LEFT breast calcifications without suspicious focal group. An approximately 5 mm oval circumscribed mass at the 12 to 1 o'clock position in the LEFT breast is incidentally noted. Ultrasound was performed. Diffuse RIGHT breast skin thickening and asymmetric decrease in size of the RIGHT breast. In the upper-outer quadrant of the RIGHT breast at the posterior depth underlying a  palpable marker there is a partially visualized spiculated, irregular mass along the implant capsule, likely corresponding with the palpable abnormality. An additional palpable marker is located slightly more inferiorly, with underlying skin thickening but no discrete breast abnormality. Targeted ultrasound of the RIGHT breast was performed. At the site of palpable abnormality in the RIGHT breast at 9 o'clock 5 cm from the nipple there is a visible skin papule within the region of skin thickening. Targeted ultrasound demonstrates a 4 mm oval hypoechoic mass with indistinct margins located within the skin at the site of the palpable finding. Representative pictures were taken of  the RIGHT breast skin thickening described above, which is present both laterally and medially and measures up to 4 mm in diameter. At the site of palpable abnormality in the RIGHT breast at 10 o'clock 6 cm from the nipple there is an irregular, spiculated mass measuring 4.7 x 1.7 x 4.0 cm along the implant capsule. It is hypervascular with an echogenic rind. There is a suggestion of multiple additional masses along the margins of the implant throughout the RIGHT breast. At the 9 o'clock position 3 cm from the nipple there is a 1 cm oval hypoechoic mass. At the RIGHT breast 4 o'clock position 2 cm from the nipple there is an irregular, heterogeneous mass with indistinct margins, possibly contiguous with the skin and measuring 1.7 x 0.9 x 2.4 cm. In the RIGHT axilla there is an irregular, hypoechoic taller than wide hypoechoic mass with an echogenic rind measuring 5 x 6 x 6 mm. It is unclear if this reflects an abnormal lymph node as it lacks normal lymph node morphology. At least 2 additional irregular masses with indistinct margins are noted in the RIGHT axilla measuring up to 6 and 7 mm respectively. Targeted ultrasound of the LEFT breast was performed, demonstrating innumerable small cysts and clustered cysts. At the 12 o'clock position 4  cm from the nipple there is a 5 mm cluster of benign cysts which likely correlates with the mammographic finding above, although it is possible that the mammographic finding correlates with 1 of the innumerable other benign cysts. There is no sonographic evidence of malignancy in the LEFT breast. IMPRESSION: 1. 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 Sites of disease include the dominant, spiculated mass in the RIGHT breast along the implant capsule measuring up to 4.7 cm, several additional smaller breast masses in the LATERAL and MEDIAL breast, 3 RIGHT axillary masses versus morphologically abnormal lymph nodes, and diffuse RIGHT breast skin thickening with at least 1 palpable mass within the skin. 2. Ultrasound-guided biopsy of the dominant RIGHT breast mass and 1 of the axillary masses is recommended. This was subsequently performed and is separately dictated. Skin punch biopsy of the RIGHT breast is recommended to assess for the possibility of inflammatory breast cancer. 3. No mammographic or sonographic evidence of malignancy in the LEFT breast. RECOMMENDATION: Ultrasound-guided biopsy of the RIGHT breast/axilla x2 I have discussed the findings and recommendations with the patient. The recommended procedure was discussed with the patient and questions were answered. Patient expressed their understanding of the recommendation. Patient will be scheduled for the procedure at her earliest convenience by the schedulers. Ordering provider will be notified. If applicable, a reminder letter will be sent to the patient regarding the next appointment. BI-RADS CATEGORY  5: Highly suggestive of malignancy. Electronically Signed   By: Norleen Croak M.D.   On: 12/26/2024 14:46   US  LIMITED ULTRASOUND INCLUDING AXILLA RIGHT BREAST Result Date:  12/26/2024 CLINICAL DATA:  RIGHT breast palpable lump at 10-11 o'clock x1 year (perceived to be increasing in size). Has heaviness in the RIGHT arm with radiating pain from the LATERAL to MEDIAL superior breast. Also with RIGHT breast pea-sized lump at 9 o'clock x3 months. EXAM: DIGITAL DIAGNOSTIC BILATERAL MAMMOGRAM WITH IMPLANTS, TOMOSYNTHESIS AND CAD; ULTRASOUND RIGHT BREAST LIMITED; ULTRASOUND LEFT BREAST LIMITED TECHNIQUE: Bilateral digital diagnostic mammography and breast tomosynthesis was performed. Standard and/or implant displaced views were performed. The images were  evaluated with computer-aided detection. ; Targeted ultrasound examination of the right breast was performed; Targeted ultrasound examination of the left breast was performed. COMPARISON:  Previous exam(s). ACR Breast Density Category c: The breasts are heterogeneously dense, which may obscure small masses. FINDINGS: The patient has prepectoral implants. Postsurgical changes of LEFT breast excisional biopsy. Diffuse LEFT breast calcifications without suspicious focal group. An approximately 5 mm oval circumscribed mass at the 12 to 1 o'clock position in the LEFT breast is incidentally noted. Ultrasound was performed. Diffuse RIGHT breast skin thickening and asymmetric decrease in size of the RIGHT breast. In the upper-outer quadrant of the RIGHT breast at the posterior depth underlying a palpable marker there is a partially visualized spiculated, irregular mass along the implant capsule, likely corresponding with the palpable abnormality. An additional palpable marker is located slightly more inferiorly, with underlying skin thickening but no discrete breast abnormality. Targeted ultrasound of the RIGHT breast was performed. At the site of palpable abnormality in the RIGHT breast at 9 o'clock 5 cm from the nipple there is a visible skin papule within the region of skin thickening. Targeted ultrasound demonstrates a 4 mm oval hypoechoic mass  with indistinct margins located within the skin at the site of the palpable finding. Representative pictures were taken of the RIGHT breast skin thickening described above, which is present both laterally and medially and measures up to 4 mm in diameter. At the site of palpable abnormality in the RIGHT breast at 10 o'clock 6 cm from the nipple there is an irregular, spiculated mass measuring 4.7 x 1.7 x 4.0 cm along the implant capsule. It is hypervascular with an echogenic rind. There is a suggestion of multiple additional masses along the margins of the implant throughout the RIGHT breast. At the 9 o'clock position 3 cm from the nipple there is a 1 cm oval hypoechoic mass. At the RIGHT breast 4 o'clock position 2 cm from the nipple there is an irregular, heterogeneous mass with indistinct margins, possibly contiguous with the skin and measuring 1.7 x 0.9 x 2.4 cm. In the RIGHT axilla there is an irregular, hypoechoic taller than wide hypoechoic mass with an echogenic rind measuring 5 x 6 x 6 mm. It is unclear if this reflects an abnormal lymph node as it lacks normal lymph node morphology. At least 2 additional irregular masses with indistinct margins are noted in the RIGHT axilla measuring up to 6 and 7 mm respectively. Targeted ultrasound of the LEFT breast was performed, demonstrating innumerable small cysts and clustered cysts. At the 12 o'clock position 4 cm from the nipple there is a 5 mm cluster of benign cysts which likely correlates with the mammographic finding above, although it is possible that the mammographic finding correlates with 1 of the innumerable other benign cysts. There is no sonographic evidence of malignancy in the LEFT breast. IMPRESSION: 1. 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 Sites of disease include the  dominant, spiculated mass in the RIGHT breast along the implant capsule measuring up to 4.7 cm, several additional smaller breast masses in the LATERAL and MEDIAL breast, 3 RIGHT axillary masses versus morphologically abnormal lymph nodes, and diffuse RIGHT breast skin thickening with at least 1 palpable mass within the skin. 2. Ultrasound-guided biopsy of the dominant RIGHT breast mass and 1 of the axillary masses is recommended. This was subsequently performed and is separately  dictated. Skin punch biopsy of the RIGHT breast is recommended to assess for the possibility of inflammatory breast cancer. 3. No mammographic or sonographic evidence of malignancy in the LEFT breast. RECOMMENDATION: Ultrasound-guided biopsy of the RIGHT breast/axilla x2 I have discussed the findings and recommendations with the patient. The recommended procedure was discussed with the patient and questions were answered. Patient expressed their understanding of the recommendation. Patient will be scheduled for the procedure at her earliest convenience by the schedulers. Ordering provider will be notified. If applicable, a reminder letter will be sent to the patient regarding the next appointment. BI-RADS CATEGORY  5: Highly suggestive of malignancy. Electronically Signed   By: Norleen Croak M.D.   On: 12/26/2024 14:46   US  LIMITED ULTRASOUND INCLUDING AXILLA LEFT BREAST  Result Date: 12/26/2024 CLINICAL DATA:  RIGHT breast palpable lump at 10-11 o'clock x1 year (perceived to be increasing in size). Has heaviness in the RIGHT arm with radiating pain from the LATERAL to MEDIAL superior breast. Also with RIGHT breast pea-sized lump at 9 o'clock x3 months. EXAM: DIGITAL DIAGNOSTIC BILATERAL MAMMOGRAM WITH IMPLANTS, TOMOSYNTHESIS AND CAD; ULTRASOUND RIGHT BREAST LIMITED; ULTRASOUND LEFT BREAST LIMITED TECHNIQUE: Bilateral digital diagnostic mammography and breast tomosynthesis was performed. Standard and/or implant displaced views were  performed. The images were evaluated with computer-aided detection. ; Targeted ultrasound examination of the right breast was performed; Targeted ultrasound examination of the left breast was performed. COMPARISON:  Previous exam(s). ACR Breast Density Category c: The breasts are heterogeneously dense, which may obscure small masses. FINDINGS: The patient has prepectoral implants. Postsurgical changes of LEFT breast excisional biopsy. Diffuse LEFT breast calcifications without suspicious focal group. An approximately 5 mm oval circumscribed mass at the 12 to 1 o'clock position in the LEFT breast is incidentally noted. Ultrasound was performed. Diffuse RIGHT breast skin thickening and asymmetric decrease in size of the RIGHT breast. In the upper-outer quadrant of the RIGHT breast at the posterior depth underlying a palpable marker there is a partially visualized spiculated, irregular mass along the implant capsule, likely corresponding with the palpable abnormality. An additional palpable marker is located slightly more inferiorly, with underlying skin thickening but no discrete breast abnormality. Targeted ultrasound of the RIGHT breast was performed. At the site of palpable abnormality in the RIGHT breast at 9 o'clock 5 cm from the nipple there is a visible skin papule within the region of skin thickening. Targeted ultrasound demonstrates a 4 mm oval hypoechoic mass with indistinct margins located within the skin at the site of the palpable finding. Representative pictures were taken of the RIGHT breast skin thickening described above, which is present both laterally and medially and measures up to 4 mm in diameter. At the site of palpable abnormality in the RIGHT breast at 10 o'clock 6 cm from the nipple there is an irregular, spiculated mass measuring 4.7 x 1.7 x 4.0 cm along the implant capsule. It is hypervascular with an echogenic rind. There is a suggestion of multiple additional masses along the margins of  the implant throughout the RIGHT breast. At the 9 o'clock position 3 cm from the nipple there is a 1 cm oval hypoechoic mass. At the RIGHT breast 4 o'clock position 2 cm from the nipple there is an irregular, heterogeneous mass with indistinct margins, possibly contiguous with the skin and measuring 1.7 x 0.9 x 2.4 cm. In the RIGHT axilla there is an irregular, hypoechoic taller than wide hypoechoic mass with an echogenic rind measuring 5 x 6 x 6 mm.  It is unclear if this reflects an abnormal lymph node as it lacks normal lymph node morphology. At least 2 additional irregular masses with indistinct margins are noted in the RIGHT axilla measuring up to 6 and 7 mm respectively. Targeted ultrasound of the LEFT breast was performed, demonstrating innumerable small cysts and clustered cysts. At the 12 o'clock position 4 cm from the nipple there is a 5 mm cluster of benign cysts which likely correlates with the mammographic finding above, although it is possible that the mammographic finding correlates with 1 of the innumerable other benign cysts. There is no sonographic evidence of malignancy in the LEFT breast. IMPRESSION: 1. 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 Sites of disease include the dominant, spiculated mass in the RIGHT breast along the implant capsule measuring up to 4.7 cm, several additional smaller breast masses in the LATERAL and MEDIAL breast, 3 RIGHT axillary masses versus morphologically abnormal lymph nodes, and diffuse RIGHT breast skin thickening with at least 1 palpable mass within the skin. 2. Ultrasound-guided biopsy of the dominant RIGHT breast mass and 1 of the axillary masses is recommended. This was subsequently performed and is separately dictated. Skin punch biopsy of the RIGHT breast is recommended to assess for  the possibility of inflammatory breast cancer. 3. No mammographic or sonographic evidence of malignancy in the LEFT breast. RECOMMENDATION: Ultrasound-guided biopsy of the RIGHT breast/axilla x2 I have discussed the findings and recommendations with the patient. The recommended procedure was discussed with the patient and questions were answered. Patient expressed their understanding of the recommendation. Patient will be scheduled for the procedure at her earliest convenience by the schedulers. Ordering provider will be notified. If applicable, a reminder letter will be sent to the patient regarding the next appointment. BI-RADS CATEGORY  5: Highly suggestive of malignancy. Electronically Signed   By: Norleen Croak M.D.   On: 12/26/2024 14:46    Assessment and plan- Patient is a 49 y.o. 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 Disease appears limited to breast and regional lymph nodes, pending full staging. Tumor burden is significant. Neoadjuvant chemotherapy (AC-T regimen) is standard of care prior to surgery, followed by mastectomy, post-mastectomy radiation, and adjuvant hormone therapy. Chemotherapy indicated to reduce risk of distant recurrence (>15% estimated benefit). Oncotpye testing is not recommended for tumors which are greater than 5 cm and or lymph node positive in premenopausal women less than 22 years of age even if the tumor is strongly ER/PR positive.  Chemotherapy is recommended regardless and in her case she would benefit from neoadjuvant chemotherapy with the hope to downstage the axilla.   Risks of chemotherapy discussed including all but not limited to nausea vomiting low blood counts risk of infections and hospitalization.  Risk of cardiotoxicity and leukemia associated with anthracycline.. Echocardiogram required prior to anthracycline.   Breast MRI and whole-body staging needed. Genetic counseling indicated. Second opinion offered as patient was somewhat taken aback by the fact that she would need chemotherapy.  She declined second opinion at this time and is willing  to proceed with chemotherapy as indicated.  - Ordered laboratory evaluation including hormone levels to confirm menopausal status. - Referred to breast surgery for evaluation and discussion of right mastectomy and options for left breast, with her preferences to be considered. - Discussed neoadjuvant chemotherapy (AC-T: doxorubicin /cyclophosphamide  followed by paclitaxel) as standard of care for premenopausal, node-positive, ER/PR+ breast cancer, with goal of reducing risk of distant recurrence and potentially decreasing tumor/nodal burden.  Treatment will be given with a curative intent -  Discussed requirement for post-mastectomy radiation due to multicentric and node-positive disease. - Discussed need for adjuvant hormone therapy after surgery and radiation, with ovarian suppression and oral anti-estrogen therapy for 10 years if premenopausal status is confirmed. - Referred to genetic counseling for assessment of hereditary breast cancer risk (e.g., BRCA1/2), as results may impact future therapy (e.g., olaparib). - Offered and encouraged second opinion if desired.  Management of hormone-based intrauterine device in context of breast cancer Presence of hormone-based intrauterine device is relatively contraindicated in ER/PR positive breast cancer due to risk of continued exogenous hormone exposure. Recent intermittent spotting suggests possible hormonal activity. Removal is necessary to eliminate exogenous hormone  exposure and to inform further management of ovarian suppression and hormone therapy. - Advised removal of hormone-based intrauterine device as soon as possible; she has an appointment scheduled for early February and will attempt to move it sooner if possible. - Ordered laboratory evaluation of hormone levels to confirm menopausal status, which will inform management of ovarian suppression and hormone therapy.       Thank you for this kind referral and the opportunity to participate in the care of this patient   Visit Diagnosis 1. Malignant neoplasm of overlapping sites of right breast in female, estrogen receptor positive (HCC)   2. Goals of care, counseling/discussion     Dr. Annah Skene, MD, MPH [facility] [phone] 12/31/2024                     [1] No Known Allergies [2]  Current Outpatient Medications:    levonorgestrel (MIRENA) 20 MCG/24HR IUD, 20 Intra Uterine Devices by Intrauterine route once. , Disp: , Rfl:    HYDROcodone -acetaminophen  (NORCO) 5-325 MG tablet, Take 1 tablet by mouth every 6 (six) hours as needed for moderate pain. (Patient not taking: Reported on 12/31/2024), Disp: 15 tablet, Rfl: 0  "

## 2024-12-31 NOTE — Progress Notes (Signed)
 Accompanied patient and family to initial medical oncology appointment.   Reviewed Breast Cancer treatment handbook.   Care plan summary given to patient.   Reviewed outreach programs and cancer center services.

## 2025-01-01 ENCOUNTER — Encounter: Payer: Self-pay | Admitting: *Deleted

## 2025-01-01 ENCOUNTER — Ambulatory Visit: Payer: Self-pay | Admitting: Oncology

## 2025-01-01 DIAGNOSIS — C50919 Malignant neoplasm of unspecified site of unspecified female breast: Secondary | ICD-10-CM

## 2025-01-01 LAB — FSH/LH
FSH: 2.6 m[IU]/mL
LH: 1.6 m[IU]/mL

## 2025-01-01 LAB — ESTRADIOL: Estradiol: 143 pg/mL

## 2025-01-01 NOTE — H&P (View-Only) (Signed)
 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 Melanee.  She first noted a firm area in her right breast approximately two to three years ago, which she initially attributed to scar tissue overlying her breast implant. Over time, the area became increasingly indurated with progressive tissue spread and hardening. She has had breast implants for 22 years without prior complications or exchanges.  Within the past six months, she identified a small, erythematous, pea-sized lesion in the skin beneath the larger tumor. The lesion has not exhibited drainage, bleeding, or ulceration, and is not associated with other physical symptoms.  Over the past year, she observed that her right breast has become noticeably smaller than her left, with prior symmetry. She attributes the change in size to the underlying tumor and associated tissue changes, including mild skin distortion, but denies severe disfigurement.  Diagnostic mammogram and ultrasound on December 26, 2024 revealed a dominant spiculated mass in the right breast along the implant capsule measuring up to 4.7 cm, additional smaller masses in the lateral and medial right breast, and three morphologically abnormal right axillary lymph nodes. There is also a double mass in the skin and suspected skin involvement. Biopsies confirmed right breast cancer and involvement of at least one axillary lymph node, with two additional abnormal nodes identified. She has dense, fibrous breast tissue and previously underwent left breast lumpectomy prior to implant placement for a cyst.  She was evaluated by her oncologist on December 31, 2024 and is undergoing further workup including MRI, CT scan, PET scan, and echocardiogram to assess disease extent.  She is eating and sleeping normally, denies systemic  symptoms such as fever, abnormal drainage, bleeding, bone pain, neurological symptoms, or respiratory symptoms, and feels well overall.  She has a history of left lung collapse in 2009 due to Pepto Streptococcus infection, requiring triple chest tube placement and thoracic surgery, and previously had a left-sided port placed for intravenous access during that hospitalization.      PAST MEDICAL HISTORY:  Past Medical History:  Diagnosis Date   History of abnormal cervical Pap smear    ascus   History of cancer    Rt Breast Cancer   Pneumonia 04/18/2007   Admitted x 10 days        PAST SURGICAL HISTORY:   Past Surgical History:  Procedure Laterality Date   AUGMENTATION MAMMOPLASTY BILATERAL W/PROSTHESIS Bilateral 2003   INCISIONAL BIOPSY BREAST Left 2003   x two   THORACOTOMY / DECORTICATION PARIETAL PLEURA Left 04/2007   ORIF 4th digit/ring proximal phalanx fx Left 07/29/2020   4th finger (Dr. Kathlynn)   ring finger plate removal Left 10/19/2020   Dr. Kathlynn         MEDICATIONS:  Outpatient Encounter Medications as of 01/01/2025  Medication Sig Dispense Refill   ibuprofen (MOTRIN) 800 MG tablet Take 1 tablet (800 mg total) by mouth every 8 (eight) hours as needed for Pain for up to 6 doses Take one tablet 1 hour prior to Procedure. Than take every 8 hours PRN with food. 6 tablet 0   levonorgestreL (MIRENA 52 MG) 20 mcg/24 hr (6 years) IUD Insert 1 each into the uterus once Follow package directions.     miSOPROStol (CYTOTEC) 200 MCG tablet Take 1 tablet (200 mcg total) by mouth as directed Take one tablet at 8 pm the night  before the procedure, then take one tablet the morning of the procedure. 2 tablet 0   phentermine (ADIPEX-P) 37.5 mg tablet Take 1 tablet (37.5 mg total) by mouth every morning before breakfast for 30 days 30 tablet 2   No facility-administered encounter medications on file as of 01/01/2025.     ALLERGIES:   Patient has no known allergies.   SOCIAL  HISTORY:  Social History   Socioeconomic History   Marital status: Divorced  Tobacco Use   Smoking status: Never   Smokeless tobacco: Never  Vaping Use   Vaping status: Never Used  Substance and Sexual Activity   Alcohol use: Yes    Alcohol/week: 2.0 standard drinks of alcohol    Types: 2 Cans of beer per week    Comment: 3-4 every other weekend   Drug use: Never   Sexual activity: Not Currently    Partners: Male    Birth control/protection: Abstinence  Social History Narrative   Lives with husband, son, stepchildren part time   Copywriter, advertising in [place]   Social Drivers of Health   Financial Resource Strain: Low Risk  (12/22/2024)   Overall Financial Resource Strain (CARDIA)    Difficulty of Paying Living Expenses: Not hard at all  Food Insecurity: No Food Insecurity (12/31/2024)   Received from [facility]   Hunger Vital Sign    Within the past 12 months, you worried that your food would run out before you got the money to buy more.: Never true    Within the past 12 months, the food you bought just didn't last and you didn't have money to get more.: Never true  Transportation Needs: No Transportation Needs (12/31/2024)   Received from [facility] - Transportation    In the past 12 months, has lack of transportation kept you from medical appointments or from getting medications?: No    In the past 12 months, has lack of transportation kept you from meetings, work, or from getting things needed for daily living?: No    FAMILY HISTORY:  Family History  Problem Relation Name Age of Onset   Asthma Father     Myocardial Infarction (Heart attack) Father     Hyperlipidemia (Elevated cholesterol) Father       GENERAL REVIEW OF SYSTEMS:   General ROS: negative for - chills, fatigue, fever, weight gain or weight loss Allergy and Immunology ROS: negative for - hives  Hematological and Lymphatic ROS: negative for - bleeding problems or bruising,  negative for palpable nodes Endocrine ROS: negative for - heat or cold intolerance, hair changes Respiratory ROS: negative for - cough, shortness of breath or wheezing Cardiovascular ROS: no chest pain or palpitations GI ROS: negative for nausea, vomiting, abdominal pain, diarrhea, constipation Musculoskeletal ROS: negative for - joint swelling or muscle pain Neurological ROS: negative for - confusion, syncope Dermatological ROS: negative for pruritus and rash  PHYSICAL EXAM:  Vitals:   01/01/25 0809  BP: 119/78  Pulse: 90  .  Ht:162.6 cm (5' 4) Wt:81.2 kg (179 lb) ADJ:Anib surface area is 1.91 meters squared. Body mass index is 30.73 kg/m.SABRA   GENERAL: Alert, active, oriented x3  HEENT: Pupils equal reactive to light. Extraocular movements are intact. Sclera clear. Palpebral conjunctiva normal red color.Pharynx clear.  NECK: Supple with no palpable mass and no adenopathy.  LUNGS: Sound clear with no rales rhonchi or wheezes.  HEART: Regular rhythm S1 and S2 without murmur.  BREAST: There is thickening and palpable  mass at the lateral aspect of the right breast.  There is a small, nonulcerated lesion on the skin at the 8 o'clock position 3 cm from the areola of the right breast.  There is palpable axillary adenopathy.  Left breast with palpable implant but without any skin changes, nipple retraction and no palpable masses.  No axillary adenopathy on the left axilla.  EXTREMITIES: Well-developed well-nourished symmetrical with no dependent edema.  NEUROLOGICAL: Awake alert oriented, facial expression symmetrical, moving all extremities.   Results Radiology Right breast diagnostic mammogram (12/26/2024): Multicentric right breast malignancy with axillary and possible isometric disease. Dominant spiculated mass along implant capsule measuring up to 4.7 cm. Additional smaller masses in lateral and medial right breast. Three right axillary masses or morphologically abnormal lymph  nodes. Double mass in skin. Suspected skin involvement with 2 mm skin thickening, raising concern for inflammatory carcinoma. Right breast ultrasound (12/26/2024): Multicentric right breast malignancy with axillary and possible isometric disease. Dominant spiculated mass along implant capsule measuring up to 4.7 cm. Additional smaller masses in lateral and medial right breast. Three right axillary masses or morphologically abnormal lymph nodes. Double mass in skin. Suspected skin involvement with 2 mm skin thickening, raising concern for inflammatory carcinoma.  Pathology Right breast core needle biopsy: Invasive carcinoma confirmed in right breast mass. Right axillary lymph node core needle biopsy: Metastatic carcinoma confirmed in at least one right axillary lymph node. Two additional morphologically abnormal lymph nodes identified.    Assessment & Plan Right breast cancer with axillary lymph node involvement Multicentric right breast cancer with axillary lymph node involvement is confirmed by imaging and biopsy. Suspected skin involvement raises concern for inflammatory breast cancer. Neoadjuvant chemotherapy is indicated to address regional disease and assess response prior to surgery. Surgical management will depend on staging and response to chemotherapy. Optional prophylactic mastectomy and implant removal on the left side were discussed for risk reduction or implant management, with risks including increased trauma, infection, and potential delay in adjuvant therapy if complications arise. Final surgical and treatment decisions will be guided by additional imaging and pathology. MRI, PET scan, and CT scan are ordered for staging and assessment of possible distant metastasis. Neoadjuvant chemotherapy is supported as initial treatment, pending staging results. Planned right mastectomy, removal of right breast implant, and axillary lymph node dissection will follow chemotherapy. Optional prophylactic  mastectomy and implant removal on the left side were discussed; she is referred to plastic surgery for further evaluation and shared decision making. A punch biopsy of the skin lesion is planned to rule out inflammatory breast cancer, to be coordinated with port placement if feasible. She is referred to plastic surgeon Dr. Lowery for evaluation of reconstruction and implant management.  Planned chemotherapy port placement Chemotherapy port is required for reliable vascular access during neoadjuvant chemotherapy. She has prior port placement and thoracic surgery, but no contraindications are identified. Port placement will facilitate chemotherapy administration and minimize venous complications. Risks include rare pneumothorax and possible need for chest tube if lung injury occurs. Anticipated outcome is immediate usability of the port for chemotherapy. Procedure details and rationale for chemotherapy port placement were discussed. Port placement will be performed under sedation in the operating room, with incisions in the chest and neck. Risks, including rare pneumothorax and need for chest tube if lung injury occurs, were reviewed. Port placement timing will be coordinated with punch biopsy for comfort. Port placement is scheduled after completion of staging studies and confirmation of dates. Anticipatory guidance regarding post-procedure care and immediate  usability of the port was provided.   Malignant neoplasm of overlapping sites of right breast in female, estrogen receptor positive (CMS/HHS-HCC) [C50.811, Z17.0]        Patient and her mother verbalized understanding, all questions were answered, and were agreeable with the plan outlined above.   I spent a total of 70 minutes in both face-to-face and non-face-to-face activities, excluding procedures performed, for this visit on the date of this encounter.   Lucas Sjogren, MD  Electronically signed by Lucas Sjogren, MD "

## 2025-01-01 NOTE — Progress Notes (Signed)
 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 Melanee.  She first noted a firm area in her right breast approximately two to three years ago, which she initially attributed to scar tissue overlying her breast implant. Over time, the area became increasingly indurated with progressive tissue spread and hardening. She has had breast implants for 22 years without prior complications or exchanges.  Within the past six months, she identified a small, erythematous, pea-sized lesion in the skin beneath the larger tumor. The lesion has not exhibited drainage, bleeding, or ulceration, and is not associated with other physical symptoms.  Over the past year, she observed that her right breast has become noticeably smaller than her left, with prior symmetry. She attributes the change in size to the underlying tumor and associated tissue changes, including mild skin distortion, but denies severe disfigurement.  Diagnostic mammogram and ultrasound on December 26, 2024 revealed a dominant spiculated mass in the right breast along the implant capsule measuring up to 4.7 cm, additional smaller masses in the lateral and medial right breast, and three morphologically abnormal right axillary lymph nodes. There is also a double mass in the skin and suspected skin involvement. Biopsies confirmed right breast cancer and involvement of at least one axillary lymph node, with two additional abnormal nodes identified. She has dense, fibrous breast tissue and previously underwent left breast lumpectomy prior to implant placement for a cyst.  She was evaluated by her oncologist on December 31, 2024 and is undergoing further workup including MRI, CT scan, PET scan, and echocardiogram to assess disease extent.  She is eating and sleeping normally, denies systemic  symptoms such as fever, abnormal drainage, bleeding, bone pain, neurological symptoms, or respiratory symptoms, and feels well overall.  She has a history of left lung collapse in 2009 due to Pepto Streptococcus infection, requiring triple chest tube placement and thoracic surgery, and previously had a left-sided port placed for intravenous access during that hospitalization.      PAST MEDICAL HISTORY:  Past Medical History:  Diagnosis Date   History of abnormal cervical Pap smear    ascus   History of cancer    Rt Breast Cancer   Pneumonia 04/18/2007   Admitted x 10 days        PAST SURGICAL HISTORY:   Past Surgical History:  Procedure Laterality Date   AUGMENTATION MAMMOPLASTY BILATERAL W/PROSTHESIS Bilateral 2003   INCISIONAL BIOPSY BREAST Left 2003   x two   THORACOTOMY / DECORTICATION PARIETAL PLEURA Left 04/2007   ORIF 4th digit/ring proximal phalanx fx Left 07/29/2020   4th finger (Dr. Kathlynn)   ring finger plate removal Left 10/19/2020   Dr. Kathlynn         MEDICATIONS:  Outpatient Encounter Medications as of 01/01/2025  Medication Sig Dispense Refill   ibuprofen (MOTRIN) 800 MG tablet Take 1 tablet (800 mg total) by mouth every 8 (eight) hours as needed for Pain for up to 6 doses Take one tablet 1 hour prior to Procedure. Than take every 8 hours PRN with food. 6 tablet 0   levonorgestreL (MIRENA 52 MG) 20 mcg/24 hr (6 years) IUD Insert 1 each into the uterus once Follow package directions.     miSOPROStol (CYTOTEC) 200 MCG tablet Take 1 tablet (200 mcg total) by mouth as directed Take one tablet at 8 pm the night  before the procedure, then take one tablet the morning of the procedure. 2 tablet 0   phentermine (ADIPEX-P) 37.5 mg tablet Take 1 tablet (37.5 mg total) by mouth every morning before breakfast for 30 days 30 tablet 2   No facility-administered encounter medications on file as of 01/01/2025.     ALLERGIES:   Patient has no known allergies.   SOCIAL  HISTORY:  Social History   Socioeconomic History   Marital status: Divorced  Tobacco Use   Smoking status: Never   Smokeless tobacco: Never  Vaping Use   Vaping status: Never Used  Substance and Sexual Activity   Alcohol use: Yes    Alcohol/week: 2.0 standard drinks of alcohol    Types: 2 Cans of beer per week    Comment: 3-4 every other weekend   Drug use: Never   Sexual activity: Not Currently    Partners: Male    Birth control/protection: Abstinence  Social History Narrative   Lives with husband, son, stepchildren part time   Copywriter, advertising in [place]   Social Drivers of Health   Financial Resource Strain: Low Risk  (12/22/2024)   Overall Financial Resource Strain (CARDIA)    Difficulty of Paying Living Expenses: Not hard at all  Food Insecurity: No Food Insecurity (12/31/2024)   Received from [facility]   Hunger Vital Sign    Within the past 12 months, you worried that your food would run out before you got the money to buy more.: Never true    Within the past 12 months, the food you bought just didn't last and you didn't have money to get more.: Never true  Transportation Needs: No Transportation Needs (12/31/2024)   Received from [facility] - Transportation    In the past 12 months, has lack of transportation kept you from medical appointments or from getting medications?: No    In the past 12 months, has lack of transportation kept you from meetings, work, or from getting things needed for daily living?: No    FAMILY HISTORY:  Family History  Problem Relation Name Age of Onset   Asthma Father     Myocardial Infarction (Heart attack) Father     Hyperlipidemia (Elevated cholesterol) Father       GENERAL REVIEW OF SYSTEMS:   General ROS: negative for - chills, fatigue, fever, weight gain or weight loss Allergy and Immunology ROS: negative for - hives  Hematological and Lymphatic ROS: negative for - bleeding problems or bruising,  negative for palpable nodes Endocrine ROS: negative for - heat or cold intolerance, hair changes Respiratory ROS: negative for - cough, shortness of breath or wheezing Cardiovascular ROS: no chest pain or palpitations GI ROS: negative for nausea, vomiting, abdominal pain, diarrhea, constipation Musculoskeletal ROS: negative for - joint swelling or muscle pain Neurological ROS: negative for - confusion, syncope Dermatological ROS: negative for pruritus and rash  PHYSICAL EXAM:  Vitals:   01/01/25 0809  BP: 119/78  Pulse: 90  .  Ht:162.6 cm (5' 4) Wt:81.2 kg (179 lb) ADJ:Anib surface area is 1.91 meters squared. Body mass index is 30.73 kg/m.SABRA   GENERAL: Alert, active, oriented x3  HEENT: Pupils equal reactive to light. Extraocular movements are intact. Sclera clear. Palpebral conjunctiva normal red color.Pharynx clear.  NECK: Supple with no palpable mass and no adenopathy.  LUNGS: Sound clear with no rales rhonchi or wheezes.  HEART: Regular rhythm S1 and S2 without murmur.  BREAST: There is thickening and palpable  mass at the lateral aspect of the right breast.  There is a small, nonulcerated lesion on the skin at the 8 o'clock position 3 cm from the areola of the right breast.  There is palpable axillary adenopathy.  Left breast with palpable implant but without any skin changes, nipple retraction and no palpable masses.  No axillary adenopathy on the left axilla.  EXTREMITIES: Well-developed well-nourished symmetrical with no dependent edema.  NEUROLOGICAL: Awake alert oriented, facial expression symmetrical, moving all extremities.   Results Radiology Right breast diagnostic mammogram (12/26/2024): Multicentric right breast malignancy with axillary and possible isometric disease. Dominant spiculated mass along implant capsule measuring up to 4.7 cm. Additional smaller masses in lateral and medial right breast. Three right axillary masses or morphologically abnormal lymph  nodes. Double mass in skin. Suspected skin involvement with 2 mm skin thickening, raising concern for inflammatory carcinoma. Right breast ultrasound (12/26/2024): Multicentric right breast malignancy with axillary and possible isometric disease. Dominant spiculated mass along implant capsule measuring up to 4.7 cm. Additional smaller masses in lateral and medial right breast. Three right axillary masses or morphologically abnormal lymph nodes. Double mass in skin. Suspected skin involvement with 2 mm skin thickening, raising concern for inflammatory carcinoma.  Pathology Right breast core needle biopsy: Invasive carcinoma confirmed in right breast mass. Right axillary lymph node core needle biopsy: Metastatic carcinoma confirmed in at least one right axillary lymph node. Two additional morphologically abnormal lymph nodes identified.    Assessment & Plan Right breast cancer with axillary lymph node involvement Multicentric right breast cancer with axillary lymph node involvement is confirmed by imaging and biopsy. Suspected skin involvement raises concern for inflammatory breast cancer. Neoadjuvant chemotherapy is indicated to address regional disease and assess response prior to surgery. Surgical management will depend on staging and response to chemotherapy. Optional prophylactic mastectomy and implant removal on the left side were discussed for risk reduction or implant management, with risks including increased trauma, infection, and potential delay in adjuvant therapy if complications arise. Final surgical and treatment decisions will be guided by additional imaging and pathology. MRI, PET scan, and CT scan are ordered for staging and assessment of possible distant metastasis. Neoadjuvant chemotherapy is supported as initial treatment, pending staging results. Planned right mastectomy, removal of right breast implant, and axillary lymph node dissection will follow chemotherapy. Optional prophylactic  mastectomy and implant removal on the left side were discussed; she is referred to plastic surgery for further evaluation and shared decision making. A punch biopsy of the skin lesion is planned to rule out inflammatory breast cancer, to be coordinated with port placement if feasible. She is referred to plastic surgeon Dr. Lowery for evaluation of reconstruction and implant management.  Planned chemotherapy port placement Chemotherapy port is required for reliable vascular access during neoadjuvant chemotherapy. She has prior port placement and thoracic surgery, but no contraindications are identified. Port placement will facilitate chemotherapy administration and minimize venous complications. Risks include rare pneumothorax and possible need for chest tube if lung injury occurs. Anticipated outcome is immediate usability of the port for chemotherapy. Procedure details and rationale for chemotherapy port placement were discussed. Port placement will be performed under sedation in the operating room, with incisions in the chest and neck. Risks, including rare pneumothorax and need for chest tube if lung injury occurs, were reviewed. Port placement timing will be coordinated with punch biopsy for comfort. Port placement is scheduled after completion of staging studies and confirmation of dates. Anticipatory guidance regarding post-procedure care and immediate  usability of the port was provided.   Malignant neoplasm of overlapping sites of right breast in female, estrogen receptor positive (CMS/HHS-HCC) [C50.811, Z17.0]        Patient and her mother verbalized understanding, all questions were answered, and were agreeable with the plan outlined above.   I spent a total of 70 minutes in both face-to-face and non-face-to-face activities, excluding procedures performed, for this visit on the date of this encounter.   Lucas Sjogren, MD  Electronically signed by Lucas Sjogren, MD "

## 2025-01-01 NOTE — Progress Notes (Signed)
 Notified Debra Shelton that her hormone levels show she is pre-menopausal per Dr. Melanee.   Debra Shelton would like to attend chemo education on 1/22, message sent to scheduling.  PET, MRI and echo are all scheduled.

## 2025-01-02 ENCOUNTER — Encounter: Payer: Self-pay | Admitting: Radiology

## 2025-01-02 ENCOUNTER — Ambulatory Visit
Admission: RE | Admit: 2025-01-02 | Discharge: 2025-01-02 | Disposition: A | Discharge status: Home / Self Care | Source: Ambulatory Visit | Attending: Oncology | Admitting: Oncology

## 2025-01-02 DIAGNOSIS — C50919 Malignant neoplasm of unspecified site of unspecified female breast: Secondary | ICD-10-CM | POA: Diagnosis present

## 2025-01-02 MED ORDER — GADOBUTROL 1 MMOL/ML IV SOLN
8.0000 mL | Freq: Once | INTRAVENOUS | Status: AC | PRN
  Administered 2025-01-02: 8 mL via INTRAVENOUS

## 2025-01-05 ENCOUNTER — Ambulatory Visit
Admission: RE | Admit: 2025-01-05 | Discharge: 2025-01-05 | Disposition: A | Source: Ambulatory Visit | Attending: Oncology

## 2025-01-05 DIAGNOSIS — Z0189 Encounter for other specified special examinations: Secondary | ICD-10-CM | POA: Diagnosis not present

## 2025-01-05 DIAGNOSIS — Z01818 Encounter for other preprocedural examination: Secondary | ICD-10-CM | POA: Diagnosis present

## 2025-01-05 DIAGNOSIS — Z170 Estrogen receptor positive status [ER+]: Secondary | ICD-10-CM | POA: Insufficient documentation

## 2025-01-05 DIAGNOSIS — C50811 Malignant neoplasm of overlapping sites of right female breast: Secondary | ICD-10-CM | POA: Diagnosis not present

## 2025-01-05 LAB — ECHOCARDIOGRAM COMPLETE
AR max vel: 3.36 cm2
AV Area VTI: 3.9 cm2
AV Area mean vel: 3.57 cm2
AV Mean grad: 2 mmHg
AV Peak grad: 4 mmHg
Ao pk vel: 1 m/s
Area-P 1/2: 3.7 cm2
MV VTI: 3.53 cm2
S' Lateral: 2.4 cm

## 2025-01-06 ENCOUNTER — Encounter: Payer: Self-pay | Admitting: Plastic Surgery

## 2025-01-06 ENCOUNTER — Ambulatory Visit: Admitting: Plastic Surgery

## 2025-01-06 VITALS — BP 122/83 | HR 72 | Ht 64.0 in | Wt 177.0 lb

## 2025-01-06 DIAGNOSIS — Z170 Estrogen receptor positive status [ER+]: Secondary | ICD-10-CM

## 2025-01-06 DIAGNOSIS — C50811 Malignant neoplasm of overlapping sites of right female breast: Secondary | ICD-10-CM

## 2025-01-06 NOTE — Progress Notes (Signed)
 "    Patient ID: Debra Shelton, female    DOB: 1976/12/07, 49 y.o.   MRN: [id number]   Chief Complaint  Patient presents with   Breast Cancer   Breast Problem    The patient is a 49 year old female here for a breast consultation for reconstruction.  She was recently diagnosed with right breast cancer involving axillary lymph node.  It is multicentric.  The patient states she has noted some discomfort in the area for a couple of years with some hardening and came to have it evaluated.  Mammogram and ultrasound with biopsy then revealed that it was in fact a cancer.  She felt like her right breast was getting smaller than her left.  She also complained of mild skin distortion.  The ultrasound on January 9 revealed a dominant spiculated mass in the right breast along the implant capsule measuring 4.7 cm and a smaller 1 in the lateral and medial aspect of the right breast.  There is concern for skin involvement.  Previously she underwent a left breast lumpectomy prior to having the implant placed.  This was due to a cyst..  She is not a smoker and she is 5 feet 4 inches tall weighs 179 pounds.  A port placement is planned for chemotherapy.  She has saline implants above the muscle as far as she knows that are in the 200 cc range and have been in for 21 years.    Review of Systems  Constitutional: Negative.   Eyes: Negative.   Respiratory: Negative.    Cardiovascular: Negative.   Gastrointestinal: Negative.   Endocrine: Negative.   Genitourinary: Negative.   Musculoskeletal: Negative.     Past Medical History:  Diagnosis Date   Breast cancer (HCC)    Pneumonia     Past Surgical History:  Procedure Laterality Date    AUGMENTATION MAMMOPLASTY BILATERAL W/PROSTHESIS Bilateral      AUGMENTATION MAMMAPLASTY Bilateral 2003   BREAST BIOPSY Right 12/26/2024   US  RT BREAST BX W LOC DEV 1ST LESION IMG BX SPEC US  GUIDE 12/26/2024 [facility]   CHEST TUBE INSERTION Left 05/2008   FRACTURE SURGERY      HARDWARE REMOVAL Left 10/19/2020   Procedure: Left ring finger plate removal;  Surgeon: Kathlynn Sharper, MD;  Location: [facility];  Service: Orthopedics;  Laterality: Left;   INCISIONAL BIOPSY BREAST Left 2003      OPEN REDUCTION INTERNAL FIXATION (ORIF) PROXIMAL PHALANX Left 07/29/2020   Procedure: OPEN REDUCTION INTERNAL FIXATION (ORIF) OF LEFT 4TH DIGIT  PROXIMAL PHALANX FRACTURE;  Surgeon: Kathlynn Sharper, MD;  Location: [facility];  Service: Orthopedics;  Laterality: Left;   THORACOTOMY / DECORTICATION PARIETAL PLEURA Left 04/2007        Current Medications[1]   Objective:   Vitals:   01/06/25 1355  BP: 122/83  Pulse: 72  SpO2: 98%    Physical Exam Vitals reviewed.  Constitutional:      Appearance: Normal appearance.  HENT:     Head: Atraumatic.  Cardiovascular:     Rate and Rhythm: Normal rate.     Pulses: Normal pulses.  Pulmonary:     Effort: Pulmonary effort is normal.  Abdominal:     General: There is no distension.     Palpations: Abdomen is soft.     Tenderness: There is no abdominal tenderness.  Musculoskeletal:        General: No swelling.  Skin:    General: Skin is warm.     Capillary Refill: Capillary  refill takes less than 2 seconds.  Neurological:     Mental Status: She is alert and oriented to person, place, and time.  Psychiatric:        Mood and Affect: Mood normal.        Behavior: Behavior normal.        Thought Content: Thought content normal.        Judgment: Judgment normal.     Assessment & Plan:  Malignant neoplasm of overlapping sites of right breast in female, estrogen receptor positive (HCC) The options for reconstruction we explained to the patient / family for breast reconstruction.  There are two general categories of reconstruction.  We can reconstruction a breast with implants or use the patient's own tissue.  These were further discussed as listed.  Breast reconstruction is an optional procedure and eligibility depends on the full  spectrum of the health of the patient and any co-morbidities.  More than one surgery is often needed to complete the reconstruction process.  The process can take three to twelve months to complete.  The breasts will not be identical due to many factors such as rib differences, shoulder asymmetry and treatments such as radiation.  The goal is to get the breasts to look normal and symmetrical in clothes.  Scars are a part of surgery and may fade some in time but will always be present under clothes.  Surgery may be an option on the non-cancer breast to achieve more symmetry.  No matter which procedure is chosen there is always the risk of complications and even failure of the body to heal.  This could result in no breast.    The options for reconstruction include:  1. Placement of a tissue expander with Acellular dermal matrix. When the expander is the desired size surgery is performed to remove the expander and place an implant.  In some cases the implant can be placed without an expander.  2. Autologous reconstruction can include using a muscle or tissue from another area of the body to create a breast.  3. Combined procedures (ie. latissismus dorsi flap) can be done with an expander / implant placed under the muscle.   The risks, benefits, scars and recovery time were discussed for each of the above. Risks include bleeding, infection, hematoma, seroma, scarring, pain, wound healing complications, flap loss, fat necrosis, capsular contracture, need for implant removal, donor site complications, bulge, hernia, umbilical necrosis, need for urgent reoperation, and need for dressing changes.   The procedure the patient selected / that was best for the patient, was then discussed in further detail.  Total time: 45 minutes. This includes time spent with the patient during the visit as well as time spent before and after the visit reviewing the chart, documenting the encounter, making phone calls and reviewing  studies.   Pictures were obtained of the patient and placed in the chart with the patient's or guardian's permission.  The patient does not want to have autologous reconstruction and is pretty adamant that she does not want implants either.  She is leaning towards removal of bilateral implants with a possible lift on the left and a flat closure on the right.  Will plan to talk in March that will give her time to get through some of the chemo and think things through.  Estefana RAMAN Tola Meas, DO    [1] No current outpatient medications on file.  "

## 2025-01-07 ENCOUNTER — Inpatient Hospital Stay: Admitting: Occupational Therapy

## 2025-01-07 ENCOUNTER — Encounter
Admission: RE | Admit: 2025-01-07 | Discharge: 2025-01-07 | Disposition: A | Discharge status: Home / Self Care | Source: Ambulatory Visit | Attending: Oncology | Admitting: Oncology

## 2025-01-07 ENCOUNTER — Ambulatory Visit: Payer: Self-pay | Admitting: Oncology

## 2025-01-07 DIAGNOSIS — C50811 Malignant neoplasm of overlapping sites of right female breast: Secondary | ICD-10-CM | POA: Diagnosis present

## 2025-01-07 DIAGNOSIS — Z170 Estrogen receptor positive status [ER+]: Secondary | ICD-10-CM | POA: Insufficient documentation

## 2025-01-07 LAB — GLUCOSE, CAPILLARY: Glucose-Capillary: 101 mg/dL — ABNORMAL HIGH (ref 70–99)

## 2025-01-07 MED ORDER — FLUDEOXYGLUCOSE F - 18 (FDG) INJECTION
10.0900 | Freq: Once | INTRAVENOUS | Status: AC | PRN
  Administered 2025-01-07: 10.09 via INTRAVENOUS

## 2025-01-08 ENCOUNTER — Inpatient Hospital Stay

## 2025-01-08 ENCOUNTER — Telehealth: Payer: Self-pay | Admitting: Licensed Clinical Social Worker

## 2025-01-08 ENCOUNTER — Ambulatory Visit: Payer: Self-pay | Admitting: Licensed Clinical Social Worker

## 2025-01-08 ENCOUNTER — Encounter: Payer: Self-pay | Admitting: *Deleted

## 2025-01-08 ENCOUNTER — Encounter: Payer: Self-pay | Admitting: Licensed Clinical Social Worker

## 2025-01-08 DIAGNOSIS — R948 Abnormal results of function studies of other organs and systems: Secondary | ICD-10-CM

## 2025-01-08 DIAGNOSIS — Z1379 Encounter for other screening for genetic and chromosomal anomalies: Secondary | ICD-10-CM

## 2025-01-08 DIAGNOSIS — Z170 Estrogen receptor positive status [ER+]: Secondary | ICD-10-CM

## 2025-01-08 NOTE — Progress Notes (Signed)
 HPI:   Debra Shelton was previously seen in the [facility] due to a personal and family history of cancer and concerns regarding a hereditary predisposition to cancer. Please refer to our prior cancer genetics clinic note for more information regarding our discussion, assessment and recommendations, at the time. Debra Shelton recent genetic test results were disclosed to her, as were recommendations warranted by these results. These results and recommendations are discussed in more detail below.  CANCER HISTORY:  Oncology History  Malignant neoplasm of overlapping sites of right breast in female, estrogen receptor positive (HCC)  01/05/2025 Initial Diagnosis   Malignant neoplasm of overlapping sites of right breast in female, estrogen receptor positive (HCC)   01/07/2025 Genetic Testing   Negative genetic testing. No pathogenic variants identified on the [facility] CancerNext+RNA Panel. The report date is 01/07/2025.   The [facility] Cancer[facility]+RNAinsight Panel includes sequencing, rearrangement analysis, and RNA analysis for the following 40 genes: APC, ATM, BAP1, BARD1, BMPR1A, BRCA1, BRCA2, BRIP1, CDH1, CDKN2A, CHEK2, FH, FLCN, MET, MLH1, MSH2, MSH6, MUTYH, NF1, NTHL1, PALB2, PMS2, PTEN, RAD51C, RAD51D, RPS20, SMAD4, STK11, TP53, TSC1, TSC2, and VHL (sequencing and deletion/duplication); AXIN2, HOXB13, MBD4, MSH3, POLD1 and POLE (sequencing only); EPCAM and GREM1 (deletion/duplication only).     FAMILY HISTORY:  We obtained a detailed, 4-generation family history.  Significant diagnoses are listed below: Family History  Problem Relation Age of Onset   Healthy Mother    Prostate cancer Father 23   Healthy Brother    Healthy Brother    Prostate cancer Maternal Grandfather    Breast cancer Neg Hx       Father - prostate cancer (treated with radiation) Maternal grandfather - prostate cancer (treated with surgery)   Debra Shelton is unaware of previous family history of genetic testing  for hereditary cancer risks. There is no reported Ashkenazi Jewish ancestry. There is no known consanguinity.  GENETIC TEST RESULTS:  The [facility]+RNA Panel found no pathogenic mutations.   The [facility] CancerNext+RNAinsight Panel includes sequencing, rearrangement analysis, and RNA analysis for the following 40 genes: APC, ATM, BAP1, BARD1, BMPR1A, BRCA1, BRCA2, BRIP1, CDH1, CDKN2A, CHEK2, FH, FLCN, MET, MLH1, MSH2, MSH6, MUTYH, NF1, NTHL1, PALB2, PMS2, PTEN, RAD51C, RAD51D, RPS20, SMAD4, STK11, TP53, TSC1, TSC2, and VHL (sequencing and deletion/duplication); AXIN2, HOXB13, MBD4, MSH3, POLD1 and POLE (sequencing only); EPCAM and GREM1 (deletion/duplication only).   The test report has been scanned into EPIC and is located under the Molecular Pathology section of the Results Review tab.  A portion of the result report is included below for reference. Genetic testing reported out on 01/07/2025.      Even though a pathogenic variant was not identified, possible explanations for the cancer in the family may include: There may be no hereditary risk for cancer in the family. The cancers in Debra Shelton and/or her family may be sporadic/familial or due to other genetic and environmental factors. There may be a gene mutation in one of these genes that current testing methods cannot detect but that chance is small. There could be another gene that has not yet been discovered, or that we have not yet tested, that is responsible for the cancer diagnoses in the family.  It is also possible there is a hereditary cause for the cancer in the family that Debra Shelton did not inherit.  Therefore, it is important to remain in touch with cancer genetics in the future so that we can continue to offer Debra Shelton the most up  to date genetic testing.   ADDITIONAL GENETIC TESTING:  We discussed with Debra Shelton that her genetic testing was fairly extensive.  If there are additional relevant genes identified to increase cancer  risk that can be analyzed in the future, we would be happy to discuss and coordinate this testing at that time.    CANCER SCREENING RECOMMENDATIONS:  Debra Shelton test result is considered negative (normal).  This means that we have not identified a hereditary cause for her personal and family history of cancer at this time.   An individual's cancer risk and medical management are not determined by genetic test results alone. Overall cancer risk assessment incorporates additional factors, including personal medical history, family history, and any available genetic information that may result in a personalized plan for cancer prevention and surveillance. Therefore, it is recommended she continue to follow the cancer management and screening guidelines provided by her oncology and primary healthcare provider.  RECOMMENDATIONS FOR FAMILY MEMBERS:   Since she did not inherit a identifiable mutation in a cancer predisposition gene included on this panel, her children could not have inherited a known mutation from her in one of these genes. Individuals in this family might be at some increased risk of developing cancer, over the general population risk, due to the family history of cancer.  Individuals in the family should notify their providers of the family history of cancer. We recommend women in this family have a yearly mammogram beginning at age 33, or 72 years younger than the earliest onset of cancer, an annual clinical breast exam, and perform monthly breast self-exams.  Family members should have colonoscopies by at age 63, or earlier, as recommended by their providers.    FOLLOW-UP:  Lastly, we discussed with Debra Shelton that cancer genetics is a rapidly advancing field and it is possible that new genetic tests will be appropriate for her and/or her family members in the future. We encouraged her to remain in contact with cancer genetics on an annual basis so we can update her personal and family  histories and let her know of advances in cancer genetics that may benefit this family.   Our contact number was provided. Debra Shelton questions were answered to her satisfaction, and she knows she is welcome to call us  at anytime with additional questions or concerns.    Dena Cary, MS, [facility] Genetic Counselor [email or web address] Phone: [phone]

## 2025-01-08 NOTE — Telephone Encounter (Signed)
 I contacted Debra Shelton to discuss her genetic testing results. No pathogenic variants were identified in the 40 genes analyzed. Detailed clinic note to follow.   The test report has been scanned into EPIC and is located under the Molecular Pathology section of the Results Review tab.  A portion of the result report is included below for reference.      Dena Cary, MS, [facility] Genetic Counselor [email or web address] Phone: [phone]

## 2025-01-09 ENCOUNTER — Other Ambulatory Visit: Payer: Self-pay | Admitting: Oncology

## 2025-01-09 ENCOUNTER — Encounter: Payer: Self-pay | Admitting: *Deleted

## 2025-01-09 DIAGNOSIS — C50811 Malignant neoplasm of overlapping sites of right female breast: Secondary | ICD-10-CM

## 2025-01-09 MED ORDER — LIDOCAINE-PRILOCAINE 2.5-2.5 % EX CREA: TOPICAL_CREAM | CUTANEOUS | 3 refills | Status: DC

## 2025-01-09 MED ORDER — PROCHLORPERAZINE MALEATE 10 MG PO TABS: 10.0000 mg | ORAL_TABLET | Freq: Four times a day (QID) | ORAL | 1 refills | Status: DC | PRN

## 2025-01-09 MED ORDER — ONDANSETRON HCL 8 MG PO TABS: ORAL_TABLET | ORAL | 1 refills | Status: DC

## 2025-01-09 MED ORDER — DEXAMETHASONE 4 MG PO TABS: ORAL_TABLET | ORAL | 1 refills | Status: DC

## 2025-01-09 NOTE — Progress Notes (Signed)
 Called to update Debra Shelton on tentative plan for port on Wednesday and begin chemo on Thursday pending MRI results.  Also notified her that medications have been sent to [facility] pharmacy.

## 2025-01-10 ENCOUNTER — Other Ambulatory Visit: Payer: Self-pay

## 2025-01-10 ENCOUNTER — Ambulatory Visit
Admission: RE | Admit: 2025-01-10 | Discharge: 2025-01-10 | Disposition: A | Source: Ambulatory Visit | Attending: Oncology

## 2025-01-10 DIAGNOSIS — R948 Abnormal results of function studies of other organs and systems: Secondary | ICD-10-CM | POA: Diagnosis present

## 2025-01-10 DIAGNOSIS — Z170 Estrogen receptor positive status [ER+]: Secondary | ICD-10-CM | POA: Diagnosis present

## 2025-01-10 DIAGNOSIS — C50811 Malignant neoplasm of overlapping sites of right female breast: Secondary | ICD-10-CM | POA: Insufficient documentation

## 2025-01-10 MED ORDER — GADOBUTROL 1 MMOL/ML IV SOLN
8.0000 mL | Freq: Once | INTRAVENOUS | Status: AC | PRN
  Administered 2025-01-10: 8 mL via INTRAVENOUS

## 2025-01-12 ENCOUNTER — Other Ambulatory Visit

## 2025-01-12 ENCOUNTER — Encounter: Payer: Self-pay | Admitting: Oncology

## 2025-01-13 MED ORDER — ORAL CARE MOUTH RINSE: 15.0000 mL | Freq: Once | OROMUCOSAL | Status: AC

## 2025-01-13 MED ORDER — LACTATED RINGERS IV SOLN: INTRAVENOUS | Status: DC

## 2025-01-13 MED ORDER — CHLORHEXIDINE GLUCONATE 0.12 % MT SOLN
15.0000 mL | Freq: Once | OROMUCOSAL | Status: AC
  Administered 2025-01-14: 15 mL via OROMUCOSAL

## 2025-01-13 NOTE — Progress Notes (Signed)
 Pharmacist Chemotherapy Monitoring - Initial Assessment    Anticipated start date: 01/15/25   The following has been reviewed per standard work regarding the patient's treatment regimen: The patient's diagnosis, treatment plan and drug doses, and organ/hematologic function Lab orders and baseline tests specific to treatment regimen  The treatment plan start date, drug sequencing, and pre-medications Prior authorization status  Patient's documented medication list, including drug-drug interaction screen and prescriptions for anti-emetics and supportive care specific to the treatment regimen The drug concentrations, fluid compatibility, administration routes, and timing of the medications to be used The patient's access for treatment and lifetime cumulative dose history, if applicable  The patient's medication allergies and previous infusion related reactions, if applicable   Changes made to treatment plan:  MD changed fulphila  to udenyca  onbody  Follow up needed:  Baseline ECHO 01/05/25: EF 60-65% Port placement planned 1/28 Pending authorization for treatment    Debra Shelton, [facility], 01/13/2025  3:23 PM

## 2025-01-14 ENCOUNTER — Inpatient Hospital Stay: Admitting: Occupational Therapy

## 2025-01-14 ENCOUNTER — Encounter: Admission: RE | Disposition: A | Payer: Self-pay | Attending: Surgery

## 2025-01-14 ENCOUNTER — Ambulatory Visit

## 2025-01-14 ENCOUNTER — Ambulatory Visit: Admitting: Anesthesiology

## 2025-01-14 ENCOUNTER — Encounter: Payer: Self-pay | Admitting: Oncology

## 2025-01-14 ENCOUNTER — Ambulatory Visit: Admission: RE | Admit: 2025-01-14 | Discharge: 2025-01-14 | Disposition: A | Attending: Surgery | Admitting: Surgery

## 2025-01-14 ENCOUNTER — Encounter: Payer: Self-pay | Admitting: Surgery

## 2025-01-14 ENCOUNTER — Other Ambulatory Visit: Payer: Self-pay

## 2025-01-14 DIAGNOSIS — C50811 Malignant neoplasm of overlapping sites of right female breast: Secondary | ICD-10-CM | POA: Diagnosis not present

## 2025-01-14 DIAGNOSIS — Z170 Estrogen receptor positive status [ER+]: Secondary | ICD-10-CM | POA: Insufficient documentation

## 2025-01-14 DIAGNOSIS — Z9882 Breast implant status: Secondary | ICD-10-CM | POA: Diagnosis not present

## 2025-01-14 DIAGNOSIS — Z01818 Encounter for other preprocedural examination: Secondary | ICD-10-CM

## 2025-01-14 DIAGNOSIS — Z452 Encounter for adjustment and management of vascular access device: Secondary | ICD-10-CM | POA: Insufficient documentation

## 2025-01-14 MED ORDER — MIDAZOLAM HCL (PF) 2 MG/2ML IJ SOLN
INTRAMUSCULAR | Status: DC | PRN
  Administered 2025-01-14: 2 mg via INTRAVENOUS

## 2025-01-14 MED ORDER — OXYCODONE HCL 5 MG PO TABS: 5.0000 mg | ORAL_TABLET | Freq: Once | ORAL | Status: DC | PRN

## 2025-01-14 MED ORDER — CEFAZOLIN SODIUM-DEXTROSE 2-3 GM-%(50ML) IV SOLR
INTRAVENOUS | Status: DC | PRN
  Administered 2025-01-14: 2 g via INTRAVENOUS

## 2025-01-14 MED ORDER — ACETAMINOPHEN 325 MG PO TABS
650.0000 mg | ORAL_TABLET | Freq: Three times a day (TID) | ORAL | 0 refills | Status: AC | PRN
  Filled 2025-01-14: qty 40, 7d supply, fill #0

## 2025-01-14 MED ORDER — EPHEDRINE SULFATE (PRESSORS) 25 MG/5ML IV SOSY
PREFILLED_SYRINGE | INTRAVENOUS | Status: DC | PRN
  Administered 2025-01-14: 5 mg via INTRAVENOUS

## 2025-01-14 MED ORDER — FENTANYL CITRATE (PF) 100 MCG/2ML IJ SOLN: 25.0000 ug | INTRAMUSCULAR | Status: DC | PRN

## 2025-01-14 MED ORDER — DEXMEDETOMIDINE HCL IN NACL 200 MCG/50ML IV SOLN
INTRAVENOUS | Status: DC | PRN
  Administered 2025-01-14: 12 ug via INTRAVENOUS

## 2025-01-14 MED ORDER — BUPIVACAINE-EPINEPHRINE (PF) 0.5% -1:200000 IJ SOLN
INTRAMUSCULAR | Status: AC
  Filled 2025-01-14: qty 30

## 2025-01-14 MED ORDER — FENTANYL CITRATE (PF) 100 MCG/2ML IJ SOLN
INTRAMUSCULAR | Status: AC
  Filled 2025-01-14: qty 2

## 2025-01-14 MED ORDER — IBUPROFEN 800 MG PO TABS
800.0000 mg | ORAL_TABLET | Freq: Three times a day (TID) | ORAL | 0 refills | Status: AC | PRN
  Filled 2025-01-14: qty 30, 10d supply, fill #0

## 2025-01-14 MED ORDER — HEPARIN SOD (PORK) LOCK FLUSH 100 UNIT/ML IV SOLN
INTRAVENOUS | Status: AC
  Filled 2025-01-14: qty 5

## 2025-01-14 MED ORDER — OXYCODONE-ACETAMINOPHEN 5-325 MG PO TABS
1.0000 | ORAL_TABLET | Freq: Three times a day (TID) | ORAL | 0 refills | Status: AC | PRN
  Filled 2025-01-14: qty 6, 2d supply, fill #0

## 2025-01-14 MED ORDER — ACETAMINOPHEN 10 MG/ML IV SOLN
INTRAVENOUS | Status: AC
  Filled 2025-01-14: qty 100

## 2025-01-14 MED ORDER — ONDANSETRON HCL 4 MG/2ML IJ SOLN: 4.0000 mg | Freq: Once | INTRAMUSCULAR | Status: DC | PRN

## 2025-01-14 MED ORDER — HEPARIN SOD (PORK) LOCK FLUSH 100 UNIT/ML IV SOLN
INTRAVENOUS | Status: DC | PRN
  Administered 2025-01-14: 500 [IU] via INTRAVENOUS

## 2025-01-14 MED ORDER — OXYCODONE HCL 5 MG/5ML PO SOLN: 5.0000 mg | Freq: Once | ORAL | Status: DC | PRN

## 2025-01-14 MED ORDER — KETAMINE HCL 10 MG/ML IJ SOLN
INTRAMUSCULAR | Status: DC | PRN
  Administered 2025-01-14: 20 mg via INTRAVENOUS

## 2025-01-14 MED ORDER — GLYCOPYRROLATE 0.2 MG/ML IJ SOLN
INTRAMUSCULAR | Status: DC | PRN
  Administered 2025-01-14: .2 mg via INTRAVENOUS

## 2025-01-14 MED ORDER — ONDANSETRON HCL 4 MG/2ML IJ SOLN
INTRAMUSCULAR | Status: DC | PRN
  Administered 2025-01-14 (×2): 4 mg via INTRAVENOUS

## 2025-01-14 MED ORDER — LACTATED RINGERS IV SOLN: INTRAVENOUS | Status: DC

## 2025-01-14 MED ORDER — KETAMINE HCL 50 MG/5ML IJ SOSY
PREFILLED_SYRINGE | INTRAMUSCULAR | Status: AC
  Filled 2025-01-14: qty 5

## 2025-01-14 MED ORDER — PROPOFOL 500 MG/50ML IV EMUL
INTRAVENOUS | Status: DC | PRN
  Administered 2025-01-14: 145 ug/kg/min via INTRAVENOUS

## 2025-01-14 MED ORDER — CEFAZOLIN SODIUM-DEXTROSE 2-4 GM/100ML-% IV SOLN
INTRAVENOUS | Status: AC
  Filled 2025-01-14: qty 100

## 2025-01-14 MED ORDER — CHLORHEXIDINE GLUCONATE 0.12 % MT SOLN
OROMUCOSAL | Status: AC
  Filled 2025-01-14: qty 15

## 2025-01-14 MED ORDER — DOCUSATE SODIUM 100 MG PO CAPS
100.0000 mg | ORAL_CAPSULE | Freq: Two times a day (BID) | ORAL | 0 refills | Status: AC | PRN
  Filled 2025-01-14: qty 20, 10d supply, fill #0

## 2025-01-14 MED ORDER — MIDAZOLAM HCL 2 MG/2ML IJ SOLN
INTRAMUSCULAR | Status: AC
  Filled 2025-01-14: qty 2

## 2025-01-14 MED ORDER — SODIUM CHLORIDE (PF) 0.9 % IJ SOLN
INTRAMUSCULAR | Status: DC | PRN
  Administered 2025-01-14: 20 mL via INTRAVENOUS

## 2025-01-14 MED ORDER — ACETAMINOPHEN 10 MG/ML IV SOLN
INTRAVENOUS | Status: DC | PRN
  Administered 2025-01-14: 1000 mg via INTRAVENOUS

## 2025-01-14 MED ORDER — DEXAMETHASONE SOD PHOSPHATE PF 10 MG/ML IJ SOLN
INTRAMUSCULAR | Status: DC | PRN
  Administered 2025-01-14: 10 mg via INTRAVENOUS

## 2025-01-14 MED ORDER — ACETAMINOPHEN 10 MG/ML IV SOLN: 1000.0000 mg | Freq: Once | INTRAVENOUS | Status: DC | PRN

## 2025-01-14 MED ORDER — BUPIVACAINE-EPINEPHRINE (PF) 0.5% -1:200000 IJ SOLN
INTRAMUSCULAR | Status: DC | PRN
  Administered 2025-01-14: 10 mL

## 2025-01-14 MED ORDER — FENTANYL CITRATE (PF) 100 MCG/2ML IJ SOLN
INTRAMUSCULAR | Status: DC | PRN
  Administered 2025-01-14 (×2): 25 ug via INTRAVENOUS
  Administered 2025-01-14: 50 ug via INTRAVENOUS

## 2025-01-14 MED ORDER — PHENYLEPHRINE HCL-NACL 20-0.9 MG/250ML-% IV SOLN
INTRAVENOUS | Status: AC
  Filled 2025-01-14: qty 250

## 2025-01-14 MED ORDER — PROPOFOL 10 MG/ML IV BOLUS
INTRAVENOUS | Status: DC | PRN
  Administered 2025-01-14: 30 mg via INTRAVENOUS

## 2025-01-14 MED FILL — Fosaprepitant Dimeglumine For IV Infusion 150 MG (Base Eq): INTRAVENOUS | Qty: 5 | Status: AC

## 2025-01-14 NOTE — Anesthesia Preprocedure Evaluation (Addendum)
"                                    Anesthesia Evaluation  Patient identified by MRN, date of birth, ID band Patient awake    Reviewed: Allergy & Precautions, NPO status , Patient's Chart, lab work & pertinent test results  History of Anesthesia Complications Negative for: history of anesthetic complications  Airway Mallampati: II   Neck ROM: Full    Dental no notable dental hx.    Pulmonary neg pulmonary ROS   Pulmonary exam normal breath sounds clear to auscultation       Cardiovascular Exercise Tolerance: Good negative cardio ROS Normal cardiovascular exam Rhythm:Regular Rate:Normal     Neuro/Psych negative neurological ROS     GI/Hepatic negative GI ROS, Neg liver ROS,,,  Endo/Other  negative endocrine ROS    Renal/GU negative Renal ROS     Musculoskeletal   Abdominal   Peds  Hematology Breast CA   Anesthesia Other Findings   Reproductive/Obstetrics                              Anesthesia Physical Anesthesia Plan  ASA: 2  Anesthesia Plan: General   Post-op Pain Management:    Induction: Intravenous  PONV Risk Score and Plan: 3 and Propofol  infusion, TIVA, Treatment may vary due to age or medical condition and Ondansetron   Airway Management Planned: Natural Airway  Additional Equipment:   Intra-op Plan:   Post-operative Plan:   Informed Consent: I have reviewed the patients History and Physical, chart, labs and discussed the procedure including the risks, benefits and alternatives for the proposed anesthesia with the patient or authorized representative who has indicated his/her understanding and acceptance.       Plan Discussed with: CRNA  Anesthesia Plan Comments: (LMA/GETA backup discussed.  Patient consented for risks of anesthesia including but not limited to:  - adverse reactions to medications - damage to eyes, teeth, lips or other oral mucosa - nerve damage due to positioning  - sore  throat or hoarseness - damage to heart, brain, nerves, lungs, other parts of body or loss of life  Informed patient about role of CRNA in peri- and intra-operative care.  Patient voiced understanding.)         Anesthesia Quick Evaluation  "

## 2025-01-14 NOTE — Anesthesia Procedure Notes (Signed)
 Procedure Name: General with mask airway Date/Time: 01/14/2025 10:07 AM  Performed by: Ledora Duncan, CRNAPre-anesthesia Checklist: Patient identified, Emergency Drugs available, Suction available and Patient being monitored Patient Re-evaluated:Patient Re-evaluated prior to induction Oxygen Delivery Method: Simple face mask Induction Type: IV induction Placement Confirmation: positive ETCO2 and breath sounds checked- equal and bilateral Dental Injury: Teeth and Oropharynx as per pre-operative assessment

## 2025-01-14 NOTE — Interval H&P Note (Signed)
 History and Physical Interval Note:  01/14/2025 9:40 AM  Damien LITTIE Feeling  has presented today for surgery, with the diagnosis of Breast Cancer.  The various methods of treatment have been discussed with the patient and family. After consideration of risks, benefits and other options for treatment, the patient has consented to  Procedures: INSERTION, TUNNELED CENTRAL VENOUS DEVICE, WITH PORT (N/A) as a surgical intervention.  The patient's history has been reviewed, patient examined, no change in status, stable for surgery.  I have reviewed the patient's chart and labs.  Questions were answered to the patient's satisfaction.     Personal scheduling conflicts with Dr. Cesar.  Due to time sensitive nature of diagnosis, offered to proceed with port placement.  All questions and concerns addressed with patient.  Confirmed prior documented hx of port placement was actually PICC line, so shouldn't be an issue proceeding with left side placement.  H&P also mentioned skin biopsy but since mastectomy is planned, no need to do at time of port placement  Noreen Mackintosh [place]

## 2025-01-14 NOTE — Discharge Instructions (Signed)
 Port cath, Care After This sheet gives you information about how to care for yourself after your procedure. Your health care provider may also give you more specific instructions. If you have problems or questions, contact your health care provider. What can I expect after the procedure? After the procedure, it is common to have: Soreness. Bruising. Itching. Follow these instructions at home: site care Follow instructions from your health care provider about how to take care of your site. Make sure you: Wash your hands with soap and water before and after you change your bandage (dressing). If soap and water are not available, use hand sanitizer. Leave stitches (sutures), skin glue, or adhesive strips in place. These skin closures may need to stay in place for 2 weeks or longer. If adhesive strip edges start to loosen and curl up, you may trim the loose edges. Do not remove adhesive strips completely unless your health care provider tells you to do that. If the area bleeds or bruises, apply gentle pressure for 10 minutes. OK TO SHOWER IN 24HRS  Check your site every day for signs of infection. Check for: Redness, swelling, or pain. Fluid or blood. Warmth. Pus or a bad smell.  General instructions Rest and then return to your normal activities as told by your health care provider.  tylenol  and advil  as needed for discomfort.  Please alternate between the two every four hours as needed for pain.    Use narcotics, if prescribed, only when tylenol  and motrin  is not enough to control pain.  325-650mg  every 8hrs to max of 3000mg /24hrs (including the 325mg  in every norco dose) for the tylenol .    Advil  up to 800mg  per dose every 8hrs as needed for pain.   Keep all follow-up visits as told by your health care provider. This is important. Contact a health care provider if: You have redness, swelling, or pain around your site. You have fluid or blood coming from your site. Your site feels warm  to the touch. You have pus or a bad smell coming from your site. You have a fever. Your sutures, skin glue, or adhesive strips loosen or come off sooner than expected. Get help right away if: You have bleeding that does not stop with pressure or a dressing. Summary After the procedure, it is common to have some soreness, bruising, and itching at the site. Follow instructions from your health care provider about how to take care of your site. Check your site every day for signs of infection. Contact a health care provider if you have redness, swelling, or pain around your site, or your site feels warm to the touch. Keep all follow-up visits as told by your health care provider. This is important. This information is not intended to replace advice given to you by your health care provider. Make sure you discuss any questions you have with your health care provider. Document Released: 12/31/2015 Document Revised: 06/03/2018 Document Reviewed: 06/03/2018 Elsevier Interactive Patient Education  [employer].

## 2025-01-14 NOTE — Anesthesia Postprocedure Evaluation (Signed)
"   Anesthesia Post Note  Patient: Debra Shelton  Procedure(s) Performed: INSERTION, TUNNELED CENTRAL VENOUS DEVICE, WITH PORT  Patient location during evaluation: PACU Anesthesia Type: General Level of consciousness: awake and alert, oriented and patient cooperative Pain management: pain level controlled Vital Signs Assessment: post-procedure vital signs reviewed and stable Respiratory status: spontaneous breathing, nonlabored ventilation and respiratory function stable Cardiovascular status: blood pressure returned to baseline and stable Postop Assessment: adequate PO intake Anesthetic complications: no   No notable events documented.   Last Vitals:  Vitals:   01/14/25 1137 01/14/25 1146  BP: 94/73 113/80  Pulse: 62 67  Resp: (!) 0 16  Temp: (!) 36.1 C (!) 36.4 C  SpO2: 100% 99%    Last Pain:  Vitals:   01/14/25 1146  TempSrc: Temporal  PainSc: 0-No pain                 Alfonso Ruths      "

## 2025-01-14 NOTE — Op Note (Signed)
--  OP NOTE  DATE OF PROCEDURE: 01/14/2025   SURGEON: Tye  ANESTHESIA: LMA  PRE-OPERATIVE DIAGNOSIS: Breast cancer requring port for chemotherapy   POST-OPERATIVE DIAGNOSIS: same  PROCEDURE(S):  1.) Percutaneous access of subclavian vein   2.) Insertion of tunneled left subclavian central venous catheter with subcutaneous port  ESTIMATED BLOOD LOSS: 70ml3  SPECIMENS: None   IMPLANTS: 27F tunneled Bard PowerPort central venous catheter with subcutaneous port  DRAINS: None   COMPLICATIONS: None apparent   CONDITION AT COMPLETION: Hemodynamically stable, awake   DISPOSITION: PACU   INDICATION(S) FOR PROCEDURE:  Patient is a 49 y.o. female who presented with above diagnosis.  All risks, benefits, and alternatives to above elective procedures were discussed with the patient, who elected to proceed, and informed consent was accordingly obtained at that time.  DETAILS OF PROCEDURE:  Patient was brought to the operative suite and appropriately identified. In Trendelenburg position, left subclavian venous access site was prepped and draped in the usual sterile fashion, and following a timeout, percutaneous venous access was obtained with anatomical landmarks, using Seldinger technique.  Dark, nonpulsatile blood flow noted, soft guidewire was advanced through the needle with no resistance, over which access needle was withdrawn. Guidewire advanced under fluoroscopy to the atriocaval junction, and introducer sheath passed over guidewire under fluoroscopy.  Guidewire removed.  Transverse left chest incision was made, incorporating access point, confirmed to accommodate the subcutaneous port, and flushed catheter.  Introducer sheath noted to be slightly dislodged at this point, so introducer and guidewire placed through it again to place in proper position near atriocaval junction under fluoroscopy.  Introducer and guidewire removed.  The catheter was introduced through the sheath and tip left  in the Atrio Caval junction under fluoro guidance and catheter cut to appropriate length.  Catheter connected to port and placed within planned fixation site.  Fluro confirmed no kink within the entire length of the catheter at this point.  Port fixed to the pocket on 1 side to avoid twisting.  1 additional suture placed around catheter.  Port was confirmed to withdraw blood and flush easily with included Heuber needle, and port saline locked. Dermis at the subcutaneous pocket was re-approximated using buried interrupted 3-0 Vicryl suture, and 4-0 Monocryl suture was used to re-approximate skin at the insertion and subcutaneous port sites in running subcuticular fashion.  Incisions then dressed with dermabond. Patient was then awakened from anesthesia and transferred to PACU in stable condition.   fluoro images saved in Epic.  CXR post op confirmed proper placement of port and no evidence of pneumothorax.

## 2025-01-14 NOTE — Transfer of Care (Signed)
 Immediate Anesthesia Transfer of Care Note  Patient: Debra Shelton  Procedure(s) Performed: INSERTION, TUNNELED CENTRAL VENOUS DEVICE, WITH PORT  Patient Location: PACU  Anesthesia Type:General  Level of Consciousness: drowsy and patient cooperative  Airway & Oxygen Therapy: Patient Spontanous Breathing and Patient connected to face mask oxygen  Post-op Assessment: Report given to RN and Post -op Vital signs reviewed and stable  Post vital signs: Reviewed and stable  Last Vitals:  Vitals Value Taken Time  BP 91/63 01/14/25 11:07  Temp 36.2 C 01/14/25 11:07  Pulse 80 01/14/25 11:07  Resp 7 01/14/25 11:07  SpO2 100 % 01/14/25 11:07  Vitals shown include unfiled device data.  Last Pain:  Vitals:   01/14/25 0918  TempSrc: Temporal  PainSc: 0-No pain      Patients Stated Pain Goal: 0 (01/14/25 9081)  Complications: No notable events documented.

## 2025-01-15 ENCOUNTER — Ambulatory Visit

## 2025-01-15 ENCOUNTER — Encounter: Payer: Self-pay | Admitting: Surgery

## 2025-01-15 ENCOUNTER — Inpatient Hospital Stay: Admitting: Oncology

## 2025-01-15 ENCOUNTER — Encounter: Payer: Self-pay | Admitting: *Deleted

## 2025-01-15 ENCOUNTER — Inpatient Hospital Stay

## 2025-01-15 VITALS — BP 101/75 | HR 83 | Temp 99.3°F | Resp 19 | Ht 64.0 in | Wt 176.3 lb

## 2025-01-15 DIAGNOSIS — C50811 Malignant neoplasm of overlapping sites of right female breast: Secondary | ICD-10-CM | POA: Diagnosis not present

## 2025-01-15 DIAGNOSIS — Z170 Estrogen receptor positive status [ER+]: Secondary | ICD-10-CM | POA: Diagnosis not present

## 2025-01-15 DIAGNOSIS — Z5111 Encounter for antineoplastic chemotherapy: Secondary | ICD-10-CM

## 2025-01-15 LAB — CBC WITH DIFFERENTIAL (CANCER CENTER ONLY)
Abs Immature Granulocytes: 0.14 10*3/uL — ABNORMAL HIGH (ref 0.00–0.07)
Basophils Absolute: 0 10*3/uL (ref 0.0–0.1)
Basophils Relative: 0 %
Eosinophils Absolute: 0 10*3/uL (ref 0.0–0.5)
Eosinophils Relative: 0 %
HCT: 37.7 % (ref 36.0–46.0)
Hemoglobin: 12.8 g/dL (ref 12.0–15.0)
Immature Granulocytes: 1 %
Lymphocytes Relative: 18 %
Lymphs Abs: 2.3 10*3/uL (ref 0.7–4.0)
MCH: 31.7 pg (ref 26.0–34.0)
MCHC: 34 g/dL (ref 30.0–36.0)
MCV: 93.3 fL (ref 80.0–100.0)
Monocytes Absolute: 1 10*3/uL (ref 0.1–1.0)
Monocytes Relative: 8 %
Neutro Abs: 9.1 10*3/uL — ABNORMAL HIGH (ref 1.7–7.7)
Neutrophils Relative %: 73 %
Platelet Count: 328 10*3/uL (ref 150–400)
RBC: 4.04 MIL/uL (ref 3.87–5.11)
RDW: 11.6 % (ref 11.5–15.5)
WBC Count: 12.5 10*3/uL — ABNORMAL HIGH (ref 4.0–10.5)
nRBC: 0 % (ref 0.0–0.2)

## 2025-01-15 LAB — CMP (CANCER CENTER ONLY)
ALT: 14 U/L (ref 0–44)
AST: 20 U/L (ref 15–41)
Albumin: 4.6 g/dL (ref 3.5–5.0)
Alkaline Phosphatase: 74 U/L (ref 38–126)
Anion gap: 12 (ref 5–15)
BUN: 10 mg/dL (ref 6–20)
CO2: 24 mmol/L (ref 22–32)
Calcium: 9.5 mg/dL (ref 8.9–10.3)
Chloride: 103 mmol/L (ref 98–111)
Creatinine: 0.77 mg/dL (ref 0.44–1.00)
GFR, Estimated: >60 mL/min
Glucose, Bld: 89 mg/dL (ref 70–99)
Potassium: 3.7 mmol/L (ref 3.5–5.1)
Sodium: 139 mmol/L (ref 135–145)
Total Bilirubin: 0.3 mg/dL (ref 0.0–1.2)
Total Protein: 7.1 g/dL (ref 6.5–8.1)

## 2025-01-15 MED ORDER — SODIUM CHLORIDE 0.9 % IV SOLN
150.0000 mg | Freq: Once | INTRAVENOUS | Status: AC
  Administered 2025-01-15: 150 mg via INTRAVENOUS
  Filled 2025-01-15: qty 150

## 2025-01-15 MED ORDER — DEXAMETHASONE SOD PHOSPHATE PF 10 MG/ML IJ SOLN
10.0000 mg | Freq: Once | INTRAMUSCULAR | Status: AC
  Administered 2025-01-15: 10 mg via INTRAVENOUS
  Filled 2025-01-15: qty 1

## 2025-01-15 MED ORDER — DOXORUBICIN HCL CHEMO IV INJECTION 2 MG/ML
60.0000 mg/m2 | Freq: Once | INTRAVENOUS | Status: AC
  Administered 2025-01-15: 114 mg via INTRAVENOUS
  Filled 2025-01-15: qty 57

## 2025-01-15 MED ORDER — SODIUM CHLORIDE 0.9 % IV SOLN
INTRAVENOUS | Status: DC
  Filled 2025-01-15 (×2): qty 250

## 2025-01-15 MED ORDER — PEGFILGRASTIM-CBQV (INF DEV) 6 MG/0.6ML ~~LOC~~ SOSY
6.0000 mg | PREFILLED_SYRINGE | Freq: Once | SUBCUTANEOUS | Status: AC
  Administered 2025-01-15: 6 mg via SUBCUTANEOUS
  Filled 2025-01-15: qty 0.6

## 2025-01-15 MED ORDER — PALONOSETRON HCL INJECTION 0.25 MG/5ML
0.2500 mg | Freq: Once | INTRAVENOUS | Status: AC
  Administered 2025-01-15: 0.25 mg via INTRAVENOUS
  Filled 2025-01-15: qty 5

## 2025-01-15 MED ORDER — SODIUM CHLORIDE 0.9 % IV SOLN
600.0000 mg/m2 | Freq: Once | INTRAVENOUS | Status: AC
  Administered 2025-01-15: 1140 mg via INTRAVENOUS
  Filled 2025-01-15: qty 57

## 2025-01-15 NOTE — Progress Notes (Signed)
 "    Hematology/Oncology Consult note [facility]  Telephone:([phone] [phone]  Patient Care Team: Orlando Comer Hoot, FNP as PCP - General (Obstetrics and Gynecology) Melanee Annah BROCKS, MD as Consulting Physician (Oncology)   Name of the patient: Debra Shelton  [id number]  Feb 02, 1976   Date of visit: 01/15/25  Diagnosis-  Cancer Staging  Malignant neoplasm of overlapping sites of right breast in female, estrogen receptor positive (HCC) Staging form: Breast, AJCC 8th Edition - Clinical stage from 01/15/2025: Stage IIA (cT3, cN1, cM0, G2, ER+, PR+, HER2-) - Signed by Melanee Annah BROCKS, MD on 01/15/2025 Histopathologic type: Adenocarcinoma, NOS Stage prefix: Initial diagnosis Histologic grading system: 3 grade system    Chief complaint/ Reason for visit-discuss imaging results and on treatment assessment prior to cycle 1 of neoadjuvant dose dense AC chemotherapy  Heme/Onc history:  Patient is a 49 year old female who underwent a bilateral diagnostic mammogram for palpable right breast lump and pain in her right arm.  Patient has noted induration around her breast implants and right breast in particular over the last 2 years.  She had undergone bilateral breast implants in the past.  Mammogram showed diffuse left breast calcifications without suspicious focal group.  There was diffuse right breast skin thickening and asymmetric decrease in the size of the right breast.  In the upper outer quadrant of the right breast spiculated irregular mass along the implant capsule at the 9 o'clock position 5 cm from the nipple which measured 4 mm in size.  At the site of palpable abnormality in the right breast at 10 o'clock position 6 cm from the nipple there was an irregular spiculated mass measuring 4.7 x 1.7 x 4 cm along the implant capsule.  Suggestion of multiple additional masses along the margins of the implant throughout the right breast measuring 1 cm and 1.7 cm  respectively.  In the right axilla there is an irregular hypoechoic mass measuring 5 x 6 x 6 mm.   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  Lymph node biopsy was also positive for invasive mammary carcinoma.  Tumor cells were estrogen +85% moderate strong intensity, PR 100% positive strong staining intensity Ki-67 15% and HER2 negative.  MRI bilateral breasts showed multiple masses in the right breast with the largest measuring 5 x 3.9 x 3.8 cm.  Posterior extent of the mass abuts the pectoralis major muscle without definite invasion.  3 other breast masses in the right breast measuring from 0.7 to 1.5 cm.  Multiple additional areas of non-mass enhancement along the inferior and lateral aspect of the right breast.  Abnormal enhancement within the nipple suspicious for malignant invasion.  Multiple prominent lymph nodes in the right axilla consistent with metastatic involvement.  No malignancy identified in the left breast.  PET CT scan shows hypermetabolic lesion lateral to the right breast implant anterior to the right breast implant.  Small hypermetabolic right axillary lymph nodes.  Small focus of accentuated activity at the C5 vertebral level without a definite CT correlate.  MRI cervical spine showed 1.2 cm T1 hypointense lesion within the C5 vertebral body indeterminate.  It could reflect an atypical hemangioma but osseous metastases not included and repeat MRI is recommended after 3 months to ensure stability  Plan is to proceed with neoadjuvant dose dense AC for 4 cycles followed by weekly/Taxol for 12 cycles followed by mastectomy and axillary lymph node dissection  Interval history- Debra Shelton is a 49 year old woman with newly diagnosed ER/PR positive, HER2 negative, clinical stage IIB (T3N1M0) right breast cancer who presents for initiation of neoadjuvant AC chemotherapy.  She has multiple right  breast masses identified on MRI, with the largest measuring approximately 5 cm. Multiple abnormal right axillary lymph nodes are present, with malignancy confirmed on biopsy of one node. The right breast is hard and shrunken compared to the left, without erythema, swelling, or discharge. Staging PET scan showed uptake at C5 in the cervical spine; however, follow-up MRI was not concerning for malignancy.  Genetic testing for hereditary breast cancer syndromes is negative. She has met with surgery to discuss mastectomy and axillary lymph node dissection. She has not started chemotherapy prior to today. She has filled prescriptions for antiemetics (prochlorperazine  and ondansetron ) and dexamethasone , and is aware of the need to take dexamethasone  for three days starting the day after chemotherapy. She has not taken any steroids prior to today. She has cut her hair in anticipation of alopecia.  She is otherwise asymptomatic, with no fever or signs of infection. Recent laboratory evaluation showed a mildly elevated white blood cell count (12.5), which she attributes to a possible minor illness the previous day. She has no history of cardiac disease and her baseline cardiac evaluation was normal. She reports chronic cervical manipulation but denies neck pain or arthralgia. She is accompanied by her son.       ECOG PS- 0 Pain scale- 0   Review of systems- Review of Systems  Constitutional:  Negative for chills, fever, malaise/fatigue and weight loss.  HENT:  Negative for congestion, ear discharge and nosebleeds.   Eyes:  Negative for blurred vision.  Respiratory:  Negative for cough, hemoptysis, sputum production, shortness of breath and wheezing.   Cardiovascular:  Negative for chest pain, palpitations, orthopnea and claudication.  Gastrointestinal:  Negative for abdominal pain, blood in stool, constipation, diarrhea, heartburn, melena, nausea and vomiting.  Genitourinary:  Negative for dysuria, flank  pain, frequency, hematuria and urgency.  Musculoskeletal:  Negative for back pain, joint pain and myalgias.  Skin:  Negative for rash.  Neurological:  Negative for dizziness, tingling, focal weakness, seizures, weakness and headaches.  Endo/Heme/Allergies:  Does not bruise/bleed easily.  Psychiatric/Behavioral:  Negative for depression and suicidal ideas. The patient does not have insomnia.       Allergies[1]   Past Medical History:  Diagnosis Date   Breast cancer (HCC)    Pneumonia      Past Surgical History:  Procedure Laterality Date    AUGMENTATION MAMMOPLASTY BILATERAL W/PROSTHESIS Bilateral      AUGMENTATION MAMMAPLASTY Bilateral 2003   BREAST BIOPSY Right 12/26/2024   US  RT BREAST BX W LOC DEV 1ST LESION IMG BX SPEC US  GUIDE 12/26/2024 [facility]   CHEST TUBE INSERTION Left 05/2008   FRACTURE SURGERY     HARDWARE REMOVAL Left 10/19/2020   Procedure: Left ring finger plate removal;  Surgeon: Kathlynn Sharper, MD;  Location: [facility];  Service: Orthopedics;  Laterality: Left;   INCISIONAL BIOPSY BREAST Left 2003      OPEN REDUCTION INTERNAL FIXATION (ORIF) PROXIMAL PHALANX Left 07/29/2020   Procedure: OPEN REDUCTION INTERNAL FIXATION (ORIF) OF LEFT 4TH DIGIT  PROXIMAL PHALANX FRACTURE;  Surgeon: Kathlynn Sharper, MD;  Location: [facility] OR[facility];  Service: Orthopedics;  Laterality: Left;   PORTACATH PLACEMENT N/A 01/14/2025   Procedure: INSERTION, TUNNELED CENTRAL VENOUS DEVICE, WITH PORT;  Surgeon: Tye Millet, DO;  Location: [facility];  Service: General;  Laterality: N/A;   THORACOTOMY / DECORTICATION PARIETAL PLEURA Left 04/2007       Social History   Socioeconomic History   Marital status: Single    Spouse name: Not on file   Number of children: 1   Years of education: Not on file   Highest education level: Not on file  Occupational History   Not on file  Tobacco Use   Smoking status: Never   Smokeless tobacco: Never  Vaping Use   Vaping status: Never Used  Substance  and Sexual Activity   Alcohol use: Yes    Alcohol/week: 21.0 standard drinks of alcohol    Types: 21 Glasses of wine per week    Comment: social drinker   Drug use: Never   Sexual activity: Not Currently  Other Topics Concern   Not on file  Social History Narrative   Not on file   Social Drivers of Health   Tobacco Use: Low Risk (01/14/2025)   Patient History    Smoking Tobacco Use: Never    Smokeless Tobacco Use: Never    Passive Exposure: Not on file  Financial Resource Strain: Low Risk  (01/01/2025)   Received from [facility]   Overall Financial Resource Strain (CARDIA)    Difficulty of Paying Living Expenses: Not very hard  Food Insecurity: No Food Insecurity (01/01/2025)   Received from [facility]   Epic    Within the past 12 months, you worried that your food would run out before you got the money to buy more.: Never true    Within the past 12 months, the food you bought just didn't last and you didn't have money to get more.: Never true  Transportation Needs: No Transportation Needs (01/01/2025)   Received from [facility] - Transportation    In the past 12 months, has lack of transportation kept you from medical appointments or from getting medications?: No    Lack of Transportation (Non-Medical): No  Physical Activity: Not on file  Stress: Not on file  Social Connections: Not on file  Intimate Partner Violence: Not At Risk (12/31/2024)   Epic    Fear of Current or Ex-Partner: No    Emotionally Abused: No    Physically Abused: No    Sexually Abused: No  Depression (PHQ2-9): Low Risk (12/31/2024)   Depression (PHQ2-9)    PHQ-2 Score: 0  Alcohol Screen: Not on file  Housing: Low Risk  (01/01/2025)   Received from [facility]   Epic    In the last 12 months, was there a time when you were not able to pay the mortgage or rent on time?: No    In the past 12 months, how many times have you  moved where you were living?: 0    At any time in the past 12 months, were you homeless or living in a shelter (including now)?: No  Utilities: Not At Risk (01/01/2025)   Received from [facility]   Epic    In the past 12 months has the electric, gas, oil, or water company threatened to shut off services in your home?: No  Health Literacy: Not on file    Family History  Problem Relation Age of Onset   Healthy Mother    Prostate cancer Father 58   Healthy Brother    Healthy Brother    Prostate cancer Maternal Grandfather    Breast cancer Neg Hx  Current Medications[2]  Physical exam: There were no vitals filed for this visit. Physical Exam Cardiovascular:     Rate and Rhythm: Normal rate and regular rhythm.     Heart sounds: Normal heart sounds.  Pulmonary:     Effort: Pulmonary effort is normal.     Breath sounds: Normal breath sounds.  Skin:    General: Skin is warm and dry.  Neurological:     Mental Status: She is alert and oriented to person, place, and time.      I have personally reviewed labs listed below:     No data to display             No data to display         I have personally reviewed Radiology images listed below: No images are attached to the encounter.  DG CHEST PORT 1 VIEW Result Date: 01/14/2025 EXAM: 1 VIEW(S) XRAY OF THE CHEST 01/14/2025 11:28:00 AM COMPARISON: None available. CLINICAL HISTORY: Port-A-Cath in place. FINDINGS: LINES, TUBES AND DEVICES: Left Port-A-Cath in place with tip at the cavoatrial junction. LUNGS AND PLEURA: No focal pulmonary opacity. No pleural effusion. No pneumothorax. HEART AND MEDIASTINUM: No acute abnormality of the cardiac and mediastinal silhouettes. BONES AND SOFT TISSUES: No acute osseous abnormality. IMPRESSION: 1. Left Port-A-Cath with tip at the cavoatrial junction. Electronically signed by: Ryan Salvage MD 01/14/2025 12:01 PM EST RP Workstation: [id number]   DG C-Arm 1-60 Min-No  Report Result Date: 01/14/2025 Fluoroscopy was utilized by the requesting physician.  No radiographic interpretation.   MR Cervical Spine W Wo Contrast Result Date: 01/13/2025 CLINICAL DATA:  Follow-up examination for abnormal activity at C5 on prior PET-CT. EXAM: MRI CERVICAL SPINE WITHOUT AND WITH CONTRAST TECHNIQUE: Multiplanar and multiecho pulse sequences of the cervical spine, to include the craniocervical junction and cervicothoracic junction, were obtained without and with intravenous contrast. CONTRAST:  8mL GADAVIST  GADOBUTROL  1 MMOL/ML IV SOLN COMPARISON:  Prior exam from 01/07/2025. FINDINGS: Alignment: Straightening of the normal cervical lordosis. No listhesis. Vertebrae: Chronic endplate Schmorl's node deformity with mild height loss at the superior endplate of T2. Cerebral body height otherwise maintained without acute or chronic fracture. Bone marrow signal intensity within normal limits. There is a subtle 1.2 cm T1 hypointense lesion within the C5 vertebral body (series 6, image 10). Associated minimally increased STIR signal intensity (series 7, image 10). Mild associated enhancement (series 11, image 10). While this finding could reflect an atypical hemangioma, possible osseous metastasis could also be considered. No other visible discrete or worrisome osseous lesions. No other abnormal marrow edema or enhancement. Cord: Normal signal and morphology.  No abnormal enhancement. Posterior Fossa, vertebral arteries, paraspinal tissues: Unremarkable. Disc levels: C2-C3: Unremarkable. C3-C4: Normal interspace. Mild bilateral facet hypertrophy. No canal or foraminal stenosis. C4-C5:  Unremarkable. C5-C6:  Unremarkable. C6-C7: Tiny central annular fissure without significant disc bulge. No stenosis. C7-T1: Negative interspace. Mild left-sided facet hypertrophy. No canal or foraminal stenosis. IMPRESSION: 1. Subtle 1.2 cm T1 hypointense lesion within the C5 vertebral body, indeterminate. While this  finding could reflect an atypical hemangioma, possible osseous metastasis not excluded. If the clinical picture is equivocal for possible metastatic disease, a short interval follow-up MRI, with and without contrast, in 2-3 months to evaluate for stability and/or progression may be helpful for further evaluation as warranted. 2. No other evidence for metastatic disease within the cervical spine. 3. Mild bilateral facet hypertrophy at C3-4 and C7-T1. Electronically Signed   By: Morene Nicholes HERO.D.  On: 01/13/2025 01:50   NM PET Image Initial (PI) Skull Base To Thigh Result Date: 01/08/2025 EXAM: PET AND CT SKULL BASE TO MID THIGH 01/07/2025 09:59:11 AM TECHNIQUE: RADIOPHARMACEUTICAL: 10.09 mCi F-18 FDG Uptake time 60 minutes. Glucose level 101 mg/dl. Blood pool SUV 2.7. PET imaging was acquired from the base of the skull to the mid thighs. Non-contrast enhanced computed tomography was obtained for attenuation correction and anatomic localization. COMPARISON: Breast MRI 01/02/2025. CLINICAL HISTORY: Staging of right breast cancer. FINDINGS: HEAD AND NECK: Soft tissue activity along the soft palate (SUV 7.1), right tonsillar pillar (SUV 7.9) and left tonsillar pillar (SUV 8.3) likely physiologic. No metabolically active cervical lymphadenopathy. CHEST: Hypermetabolic lesion lateral to the right breast implant with maximum SUV 11.6, with some nodularity extending above the implant. Hypermetabolic lesion anterior to the right breast implant with maximum SUV 8.8. Small but hypermetabolic right axillary lymph nodes including a 0.6 cm node on image 40 of series 6 with maximum SUV 9.3. No metabolically active pulmonary nodules. ABDOMEN AND PELVIS: No metabolically active intraperitoneal mass. No metabolically active lymphadenopathy. Physiologic activity within the gastrointestinal and genitourinary systems. BONES AND SOFT TISSUE: Small focus of abnormal accentuated activity at about the C5 vertebral body level with  maximum SUV 5.3, no definite associated lesion on the CT data. Significance uncertain but a small metastatic lesion cannot be totally excluded, and cervical spine MRI with and without contrast should be considered for further workup. IMPRESSION: 1. Hypermetabolic lesion lateral to the right breast implant (SUV 11.6) with nodularity extending above the implant. 2. Hypermetabolic lesion anterior to the right breast implant (SUV 8.8). 3. Small hypermetabolic right axillary lymph nodes, including a 0.6 cm node (SUV 9.3). 4. Small focus of abnormal accentuated activity at the C5 vertebral body level (SUV 5.3) without a definite CT correlate; cervical spine MRI with and without contrast should be considered for further evaluation to exclude skeletal oligometastatic disease. Electronically signed by: Ryan Salvage MD 01/08/2025 09:35 AM EST RP Workstation: [id number]   MR BREAST BILATERAL W WO CONTRAST INC CAD Addendum Date: 01/07/2025 ADDENDUM REPORT: 01/07/2025 11:44 ADDENDUM: Correction to findings section: Mass 2 is located in the RIGHT breast. No LEFT breast mass identified. Electronically Signed   By: Aliene Lloyd M.D.   On: 01/07/2025 11:44   Result Date: 01/07/2025 CLINICAL DATA:  49 year old woman with biopsy-proven multicentric malignancy of the RIGHT breast and axillary lymph node. EXAM: BILATERAL BREAST MRI WITH AND WITHOUT CONTRAST TECHNIQUE: Multiplanar, multisequence MR images of both breasts were obtained prior to and following the intravenous administration of 8 ml of Gadavist  Three-dimensional MR images were rendered by post-processing of the original MR data on an independent workstation. The three-dimensional MR images were interpreted, and findings are reported in the following complete MRI report for this study. Three dimensional images were evaluated at the independent interpreting workstation using the DynaCAD thin client. COMPARISON:  Diagnostic mammogram 12/26/2024. RIGHT breast  ultrasound 12/26/2024. Ultrasound-guided biopsy of RIGHT breast and axilla 12/26/2024. FINDINGS: Breast composition: c. Heterogeneous fibroglandular tissue. Background parenchymal enhancement: Moderate Right breast: Prepectoral implant is intact. Numerous enhancing masses, as well as, areas of non masslike enhancement are seen throughout the RIGHT breast. Mass 1: Irregularly shaped enhancing mass with spiculated margins measuring 5.0 x 3.9 x 3.8 cm is located in the upper-outer RIGHT breast. The posterior extent of this mass abuts the pectoralis major muscle, without definitive invasion. Medial margin of the mass abuts, and possibly invades the pseudo capsule of the RIGHT breast implant.  Biopsy marking clip is seen within this mass which previously indicated invasive ductal carcinoma. Mass 2: Enhancing oval mass seen in the posterior depth of the upper inner LEFT breast measuring 1.0 x 0.6 x 0.7 cm. Mass 3: Enhancing oval mass seen in the anterior depth of the inferior retroareolar RIGHT breast measuring 1.5 x 1.0 x 1.2 cm. There is a region of non masslike enhancement extending laterally from this mass best seen on images 33-44 of series 5. This area of non masslike enhancement extends approximately 3.7 cm posterior and lateral to this mass. Mass 4: 0.7 x 0.6 x 0.6 cm oval enhancing mass seen in the anterior depth of the lower outer RIGHT breast. Abnormal enhancement within the nipple best seen on image 52 of series 5 is suspicious for malignant invasion. Multiple additional areas of non masslike enhancement seen along the inferior and lateral RIGHT breast, abutting the breast implant. These can be best seen on images 21, 22, 26, 33, and 34 of series 5. Left breast: No mass or abnormal enhancement. Prepectoral implant is intact. Lymph nodes: Multiple prominent lymph nodes are seen within the RIGHT axilla, 1 of which contains a biopsy marking clip (image 85, series 5), consistent with known metastatic involvement.  The lymph node containing biopsy marking clip measures 1.1 x 0.8 x 1.1 cm. No enlarged LEFT axillary lymph nodes are identified. Ancillary findings:  None. Breast composition: c. Heterogeneous fibroglandular tissue. Background parenchymal enhancement: Minimal IMPRESSION: 1. Multicentric RIGHT breast malignancy with the largest mass measuring 5.0 x 3.9 x 3.8 cm. Multiple additional RIGHT breast masses and areas of non masslike enhancement consistent with malignancy. Abnormal enhancement of the RIGHT nipple is also suspicious for malignancy. 2. Metastatic lymphadenopathy of the RIGHT axilla. 3. No MR evidence of LEFT breast malignancy. RECOMMENDATION: Per treatment plan BI-RADS CATEGORY  6: Known biopsy-proven malignancy. Electronically Signed: By: Aliene Lloyd M.D. On: 01/07/2025 10:10   ECHOCARDIOGRAM COMPLETE Result Date: 01/05/2025    ECHOCARDIOGRAM REPORT   Patient Name:   ANAVEY COOMBES Date of Exam: 01/05/2025 Medical Rec #:  [id number]    Height:       64.2 in Accession #:    [id number]   Weight:       179.0 lb Date of Birth:  1976/09/03     BSA:          1.870 m Patient Age:    48 years     BP:           109/88 mmHg Patient Gender: F            HR:           79 bpm. Exam Location:  [facility] Procedure: 2D Echo, Cardiac Doppler, Color Doppler, 3D Echo and Strain Analysis            (Both Spectral and Color Flow Doppler were utilized during            procedure). Indications:     Chemo Z09                  Invasive carcinoma of breast C50.919  History:         Patient has no prior history of Echocardiogram examinations.                  Breast cancer.  Sonographer:     Christopher Furnace Referring Phys:  [id number] [facility] C Makaela Cando Diagnosing Phys: Deatrice Cage MD  Sonographer Comments: Global longitudinal strain was attempted.  IMPRESSIONS  1. Left ventricular ejection fraction, by estimation, is 60 to 65%. The left ventricle has normal function. The left ventricle has no regional wall motion abnormalities. Left ventricular  diastolic parameters were normal.  2. Right ventricular systolic function is normal. The right ventricular size is normal. There is normal pulmonary artery systolic pressure.  3. The mitral valve is normal in structure. No evidence of mitral valve regurgitation. No evidence of mitral stenosis.  4. The aortic valve is normal in structure. Aortic valve regurgitation is not visualized. No aortic stenosis is present.  5. The inferior vena cava is normal in size with greater than 50% respiratory variability, suggesting right atrial pressure of 3 mmHg. FINDINGS  Left Ventricle: Left ventricular ejection fraction, by estimation, is 60 to 65%. The left ventricle has normal function. The left ventricle has no regional wall motion abnormalities. Global longitudinal strain performed but not reported based on interpreter judgement due to suboptimal tracking. The left ventricular internal cavity size was normal in size. There is borderline left ventricular hypertrophy. Left ventricular diastolic parameters were normal. Right Ventricle: The right ventricular size is normal. No increase in right ventricular wall thickness. Right ventricular systolic function is normal. There is normal pulmonary artery systolic pressure. The tricuspid regurgitant velocity is 1.73 m/s, and  with an assumed right atrial pressure of 3 mmHg, the estimated right ventricular systolic pressure is 15.0 mmHg. Left Atrium: Left atrial size was normal in size. Right Atrium: Right atrial size was normal in size. Pericardium: There is no evidence of pericardial effusion. Mitral Valve: The mitral valve is normal in structure. No evidence of mitral valve regurgitation. No evidence of mitral valve stenosis. MV peak gradient, 2.5 mmHg. The mean mitral valve gradient is 1.0 mmHg. Tricuspid Valve: The tricuspid valve is normal in structure. Tricuspid valve regurgitation is not demonstrated. No evidence of tricuspid stenosis. Aortic Valve: The aortic valve is normal  in structure. Aortic valve regurgitation is not visualized. No aortic stenosis is present. Aortic valve mean gradient measures 2.0 mmHg. Aortic valve peak gradient measures 4.0 mmHg. Aortic valve area, by VTI measures 3.90 cm. Pulmonic Valve: The pulmonic valve was normal in structure. Pulmonic valve regurgitation is not visualized. No evidence of pulmonic stenosis. Aorta: The aortic root is normal in size and structure. Venous: The inferior vena cava is normal in size with greater than 50% respiratory variability, suggesting right atrial pressure of 3 mmHg. IAS/Shunts: No atrial level shunt detected by color flow Doppler.  LEFT VENTRICLE PLAX 2D LVIDd:         3.80 cm   Diastology LVIDs:         2.40 cm   LV e' medial:   6.09 cm/s LV PW:         0.80 cm   LV E/e' medial: 11.8 LV IVS:        1.20 cm LVOT diam:     2.00 cm LV SV:         66 LV SV Index:   35 LVOT Area:     3.14 cm  3D Volume EF: LV IVRT:       100 msec  3D EF:        54 %                          LV EDV:       116 ml  LV ESV:       53 ml                          LV SV:        63 ml RIGHT VENTRICLE RV Basal diam:  1.60 cm  PULMONARY VEINS RV Mid diam:    1.30 cm  Diastolic Velocity: 32.20 cm/s TAPSE (M-mode): 2.2 cm   S/D Velocity:       1.50                          Systolic Velocity:  48.30 cm/s LEFT ATRIUM             Index        RIGHT ATRIUM          Index LA diam:        2.40 cm 1.28 cm/m   RA Area:     8.30 cm LA Vol (A2C):   23.7 ml 12.68 ml/m  RA Volume:   11.10 ml 5.94 ml/m LA Vol (A4C):   24.4 ml 13.05 ml/m LA Biplane Vol: 23.6 ml 12.62 ml/m  AORTIC VALVE AV Area (Vmax):    3.36 cm AV Area (Vmean):   3.57 cm AV Area (VTI):     3.90 cm AV Vmax:           100.15 cm/s AV Vmean:          69.900 cm/s AV VTI:            0.169 m AV Peak Grad:      4.0 mmHg AV Mean Grad:      2.0 mmHg LVOT Vmax:         107.00 cm/s LVOT Vmean:        79.400 cm/s LVOT VTI:          0.210 m LVOT/AV VTI ratio: 1.24  AORTA Ao Root  diam: 2.80 cm MITRAL VALVE               TRICUSPID VALVE MV Area (PHT): 3.70 cm    TR Peak grad:   12.0 mmHg MV Area VTI:   3.53 cm    TR Vmax:        173.00 cm/s MV Peak grad:  2.5 mmHg MV Mean grad:  1.0 mmHg    SHUNTS MV Vmax:       0.80 m/s    Systemic VTI:  0.21 m MV Vmean:      56.5 cm/s   Systemic Diam: 2.00 cm MV Decel Time: 205 msec MV E velocity: 71.80 cm/s MV A velocity: 75.50 cm/s MV E/A ratio:  0.95 Deatrice Cage MD Electronically signed by Deatrice Cage MD Signature Date/Time: 01/05/2025/10:53:54 AM    Final    US  AXILLARY NODE CORE BIOPSY RIGHT Addendum Date: 12/30/2024 ADDENDUM REPORT: 12/30/2024 07:33 ADDENDUM: PATHOLOGY revealed: Site 1. Breast, right, needle core biopsy, 10:00 6 cmfn 4.7cm (ribbon clip)- INVASIVE DUCTAL CARCINOMA, SEE NOTE DUCTAL CARCINOMA IN SITU, COMEDO, INTERMEDIATE NUCLEAR GRADE, WITH NECROSIS- OVERALL GRADE: 2- LYMPHOVASCULAR INVASION: PRESENT- CANCER LENGTH: 1.5 CM- CALCIFICATIONS: PRESENT IN DCIS Pathology results are CONCORDANT with imaging findings, per Dr. Norleen Croak. PATHOLOGY revealed: Site 2. Lymph node, needle/core biopsy, Right axillary node/mass 6mm (hydromark coil clip)- INVASIVE DUCTAL CARCINOMA, GRADE 2 INVOLVING ADIPOSE TISSUE Pathology results are CONCORDANT with imaging findings, per Dr. Norleen Croak. Pathology results and recommendations below were discussed with patient by telephone.  Patient reported biopsy site within normal limits with slight tenderness at the site. Post biopsy care instructions were reviewed, questions were answered and my direct phone number was provided to patient. Patient was instructed to call [facility] if any concerns or questions arise related to the biopsy. RECOMMENDATIONS: 1. Surgical and oncological consultation. Request for surgical and oncological consultation was relayed to Shasta Ada, Nurse Navigator at [facility]. 2. Skin punch biopsy recommended for suspected skin involvement. 3.  Recommend pretreatment MRI to determine extent of disease given patient breast density and age. Pathology results reported by Mliss CHARM Molt RN 12/29/2024. Electronically Signed   By: Norleen Croak M.D.   On: 12/30/2024 07:33   Result Date: 12/30/2024 CLINICAL DATA:  BI-RADS 5 mass in the LATERAL RIGHT breast. Multiple abnormal masses versus lymph nodes in the RIGHT axilla EXAM: ULTRASOUND GUIDED RIGHT BREAST CORE NEEDLE BIOPSY; US  AXILLARY NODE CORE BIOPSY RIGHT COMPARISON:  Previous exam(s). PROCEDURE: I met with the patient and we discussed the procedure of ultrasound-guided biopsy, including benefits and alternatives. We discussed the high likelihood of a successful procedure. We discussed the risks of the procedure, including infection, bleeding, implant rupture, tissue injury, clip migration, and inadequate sampling. Informed written consent was given. The usual time-out protocol was performed immediately prior to the procedure. Site 1: RIGHT breast 10 o'clock 6 cm from the nipple Lesion quadrant: Upper-outer Using sterile technique and 1% lidocaine  and 1% lidocaine  with epinephrine  as local anesthetic, under direct ultrasound visualization, a 12 gauge spring-loaded device was used to perform biopsy of the RIGHT breast mass at 10 o'clock 6 cm from the nipple using a LATERAL approach. At the conclusion of the procedure ribbon-shaped tissue marker clip was deployed into the biopsy cavity. Site 2: RIGHT axillary mass Using sterile technique and 1% lidocaine  and 1% lidocaine  with epinephrine  as local anesthetic, under direct ultrasound visualization, a 14 gauge spring-loaded device was used to perform biopsy of a RIGHT axillary mass using a LATERAL approach. At the conclusion of the procedure [facility] coil-shaped tissue marker clip was deployed into the biopsy cavity. Follow up 2 view mammogram was performed and dictated separately. IMPRESSION: Ultrasound guided biopsy of a RIGHT breast mass at 10 o'clock and a  RIGHT axillary mass. No apparent complications. Electronically Signed: By: Norleen Croak M.D. On: 12/26/2024 14:48   US  RT BREAST BX W LOC DEV 1ST LESION IMG BX SPEC US  GUIDE Addendum Date: 12/30/2024 ADDENDUM REPORT: 12/30/2024 07:33 ADDENDUM: PATHOLOGY revealed: Site 1. Breast, right, needle core biopsy, 10:00 6 cmfn 4.7cm (ribbon clip)- INVASIVE DUCTAL CARCINOMA, SEE NOTE DUCTAL CARCINOMA IN SITU, COMEDO, INTERMEDIATE NUCLEAR GRADE, WITH NECROSIS- OVERALL GRADE: 2- LYMPHOVASCULAR INVASION: PRESENT- CANCER LENGTH: 1.5 CM- CALCIFICATIONS: PRESENT IN DCIS Pathology results are CONCORDANT with imaging findings, per Dr. Norleen Croak. PATHOLOGY revealed: Site 2. Lymph node, needle/core biopsy, Right axillary node/mass 6mm (hydromark coil clip)- INVASIVE DUCTAL CARCINOMA, GRADE 2 INVOLVING ADIPOSE TISSUE Pathology results are CONCORDANT with imaging findings, per Dr. Norleen Croak. Pathology results and recommendations below were discussed with patient by telephone. Patient reported biopsy site within normal limits with slight tenderness at the site. Post biopsy care instructions were reviewed, questions were answered and my direct phone number was provided to patient. Patient was instructed to call [facility] if any concerns or questions arise related to the biopsy. RECOMMENDATIONS: 1. Surgical and oncological consultation. Request for surgical and oncological consultation was relayed to Shasta Ada, Nurse Navigator at [facility]. 2.  Skin punch biopsy recommended for suspected skin involvement. 3. Recommend pretreatment MRI to determine extent of disease given patient breast density and age. Pathology results reported by Mliss CHARM Molt RN 12/29/2024. Electronically Signed   By: Norleen Croak M.D.   On: 12/30/2024 07:33   Result Date: 12/30/2024 CLINICAL DATA:  BI-RADS 5 mass in the LATERAL RIGHT breast. Multiple abnormal masses versus lymph nodes in the RIGHT axilla EXAM: ULTRASOUND GUIDED  RIGHT BREAST CORE NEEDLE BIOPSY; US  AXILLARY NODE CORE BIOPSY RIGHT COMPARISON:  Previous exam(s). PROCEDURE: I met with the patient and we discussed the procedure of ultrasound-guided biopsy, including benefits and alternatives. We discussed the high likelihood of a successful procedure. We discussed the risks of the procedure, including infection, bleeding, implant rupture, tissue injury, clip migration, and inadequate sampling. Informed written consent was given. The usual time-out protocol was performed immediately prior to the procedure. Site 1: RIGHT breast 10 o'clock 6 cm from the nipple Lesion quadrant: Upper-outer Using sterile technique and 1% lidocaine  and 1% lidocaine  with epinephrine  as local anesthetic, under direct ultrasound visualization, a 12 gauge spring-loaded device was used to perform biopsy of the RIGHT breast mass at 10 o'clock 6 cm from the nipple using a LATERAL approach. At the conclusion of the procedure ribbon-shaped tissue marker clip was deployed into the biopsy cavity. Site 2: RIGHT axillary mass Using sterile technique and 1% lidocaine  and 1% lidocaine  with epinephrine  as local anesthetic, under direct ultrasound visualization, a 14 gauge spring-loaded device was used to perform biopsy of a RIGHT axillary mass using a LATERAL approach. At the conclusion of the procedure [facility] coil-shaped tissue marker clip was deployed into the biopsy cavity. Follow up 2 view mammogram was performed and dictated separately. IMPRESSION: Ultrasound guided biopsy of a RIGHT breast mass at 10 o'clock and a RIGHT axillary mass. No apparent complications. Electronically Signed: By: Norleen Croak M.D. On: 12/26/2024 14:48   MM CLIP PLACEMENT RIGHT Result Date: 12/26/2024 CLINICAL DATA:  Status post RIGHT breast ultrasound biopsy x2 EXAM: 3D DIAGNOSTIC RIGHT MAMMOGRAM POST ULTRASOUND BIOPSY COMPARISON:  Previous exam(s). ACR Breast Density Category c: The breasts are heterogeneously dense, which may  obscure small masses. FINDINGS: A prepectoral implant is present and is intact. 3D Mammographic images were obtained following ultrasound guided biopsy of the RIGHT breast mass at 10 o'clock and a RIGHT axillary mass. Appropriate positioning of the ribbon-shaped biopsy marking clip at the site of biopsy in the RIGHT breast mass at 10 o'clock. Appropriate positioning of the HydroMARK coil-shaped biopsy marking clip at the site of biopsy in the RIGHT axillary mass. IMPRESSION: Appropriate positioning of the biopsy marking clips at the sites of biopsy in the RIGHT breast and axilla. Final Assessment: Post Procedure Mammograms for Marker Placement Electronically Signed   By: Norleen Croak M.D.   On: 12/26/2024 14:49   MM 3D DIAGNOSTIC MAMMOGRAM BILATERAL BREAST W/IMPLANT Result Date: 12/26/2024 CLINICAL DATA:  RIGHT breast palpable lump at 10-11 o'clock x1 year (perceived to be increasing in size). Has heaviness in the RIGHT arm with radiating pain from the LATERAL to MEDIAL superior breast. Also with RIGHT breast pea-sized lump at 9 o'clock x3 months. EXAM: DIGITAL DIAGNOSTIC BILATERAL MAMMOGRAM WITH IMPLANTS, TOMOSYNTHESIS AND CAD; ULTRASOUND RIGHT BREAST LIMITED; ULTRASOUND LEFT BREAST LIMITED TECHNIQUE: Bilateral digital diagnostic mammography and breast tomosynthesis was performed. Standard and/or implant displaced views were performed. The images were evaluated with computer-aided detection. ; Targeted ultrasound examination of the right breast was performed; Targeted ultrasound examination of the  left breast was performed. COMPARISON:  Previous exam(s). ACR Breast Density Category c: The breasts are heterogeneously dense, which may obscure small masses. FINDINGS: The patient has prepectoral implants. Postsurgical changes of LEFT breast excisional biopsy. Diffuse LEFT breast calcifications without suspicious focal group. An approximately 5 mm oval circumscribed mass at the 12 to 1 o'clock position in the LEFT  breast is incidentally noted. Ultrasound was performed. Diffuse RIGHT breast skin thickening and asymmetric decrease in size of the RIGHT breast. In the upper-outer quadrant of the RIGHT breast at the posterior depth underlying a palpable marker there is a partially visualized spiculated, irregular mass along the implant capsule, likely corresponding with the palpable abnormality. An additional palpable marker is located slightly more inferiorly, with underlying skin thickening but no discrete breast abnormality. Targeted ultrasound of the RIGHT breast was performed. At the site of palpable abnormality in the RIGHT breast at 9 o'clock 5 cm from the nipple there is a visible skin papule within the region of skin thickening. Targeted ultrasound demonstrates a 4 mm oval hypoechoic mass with indistinct margins located within the skin at the site of the palpable finding. Representative pictures were taken of the RIGHT breast skin thickening described above, which is present both laterally and medially and measures up to 4 mm in diameter. At the site of palpable abnormality in the RIGHT breast at 10 o'clock 6 cm from the nipple there is an irregular, spiculated mass measuring 4.7 x 1.7 x 4.0 cm along the implant capsule. It is hypervascular with an echogenic rind. There is a suggestion of multiple additional masses along the margins of the implant throughout the RIGHT breast. At the 9 o'clock position 3 cm from the nipple there is a 1 cm oval hypoechoic mass. At the RIGHT breast 4 o'clock position 2 cm from the nipple there is an irregular, heterogeneous mass with indistinct margins, possibly contiguous with the skin and measuring 1.7 x 0.9 x 2.4 cm. In the RIGHT axilla there is an irregular, hypoechoic taller than wide hypoechoic mass with an echogenic rind measuring 5 x 6 x 6 mm. It is unclear if this reflects an abnormal lymph node as it lacks normal lymph node morphology. At least 2 additional irregular masses with  indistinct margins are noted in the RIGHT axilla measuring up to 6 and 7 mm respectively. Targeted ultrasound of the LEFT breast was performed, demonstrating innumerable small cysts and clustered cysts. At the 12 o'clock position 4 cm from the nipple there is a 5 mm cluster of benign cysts which likely correlates with the mammographic finding above, although it is possible that the mammographic finding correlates with 1 of the innumerable other benign cysts. There is no sonographic evidence of malignancy in the LEFT breast. IMPRESSION: 1. 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 Sites of disease include the dominant, spiculated mass in the RIGHT breast along the implant capsule measuring up to 4.7 cm, several additional smaller breast masses in the LATERAL and MEDIAL breast, 3 RIGHT axillary masses versus morphologically abnormal lymph nodes, and diffuse RIGHT breast skin thickening with at least 1 palpable mass within the skin. 2. Ultrasound-guided biopsy of the dominant RIGHT breast mass and 1 of the axillary masses is recommended. This was subsequently performed and is separately dictated. Skin punch biopsy of the RIGHT breast is recommended to assess for the possibility of inflammatory breast cancer.  3. No mammographic or sonographic evidence of malignancy in the LEFT breast. RECOMMENDATION: Ultrasound-guided biopsy of the RIGHT breast/axilla x2 I have discussed the findings and recommendations with the patient. The recommended procedure was discussed with the patient and questions were answered. Patient expressed their understanding of the recommendation. Patient will be scheduled for the procedure at her earliest convenience by the schedulers. Ordering provider will be notified. If applicable, a reminder letter will be sent to the  patient regarding the next appointment. BI-RADS CATEGORY  5: Highly suggestive of malignancy. Electronically Signed   By: Norleen Croak M.D.   On: 12/26/2024 14:46   US  LIMITED ULTRASOUND INCLUDING AXILLA RIGHT BREAST Result Date: 12/26/2024 CLINICAL DATA:  RIGHT breast palpable lump at 10-11 o'clock x1 year (perceived to be increasing in size). Has heaviness in the RIGHT arm with radiating pain from the LATERAL to MEDIAL superior breast. Also with RIGHT breast pea-sized lump at 9 o'clock x3 months. EXAM: DIGITAL DIAGNOSTIC BILATERAL MAMMOGRAM WITH IMPLANTS, TOMOSYNTHESIS AND CAD; ULTRASOUND RIGHT BREAST LIMITED; ULTRASOUND LEFT BREAST LIMITED TECHNIQUE: Bilateral digital diagnostic mammography and breast tomosynthesis was performed. Standard and/or implant displaced views were performed. The images were evaluated with computer-aided detection. ; Targeted ultrasound examination of the right breast was performed; Targeted ultrasound examination of the left breast was performed. COMPARISON:  Previous exam(s). ACR Breast Density Category c: The breasts are heterogeneously dense, which may obscure small masses. FINDINGS: The patient has prepectoral implants. Postsurgical changes of LEFT breast excisional biopsy. Diffuse LEFT breast calcifications without suspicious focal group. An approximately 5 mm oval circumscribed mass at the 12 to 1 o'clock position in the LEFT breast is incidentally noted. Ultrasound was performed. Diffuse RIGHT breast skin thickening and asymmetric decrease in size of the RIGHT breast. In the upper-outer quadrant of the RIGHT breast at the posterior depth underlying a palpable marker there is a partially visualized spiculated, irregular mass along the implant capsule, likely corresponding with the palpable abnormality. An additional palpable marker is located slightly more inferiorly, with underlying skin thickening but no discrete breast abnormality. Targeted ultrasound of the RIGHT breast was  performed. At the site of palpable abnormality in the RIGHT breast at 9 o'clock 5 cm from the nipple there is a visible skin papule within the region of skin thickening. Targeted ultrasound demonstrates a 4 mm oval hypoechoic mass with indistinct margins located within the skin at the site of the palpable finding. Representative pictures were taken of the RIGHT breast skin thickening described above, which is present both laterally and medially and measures up to 4 mm in diameter. At the site of palpable abnormality in the RIGHT breast at 10 o'clock 6 cm from the nipple there is an irregular, spiculated mass measuring 4.7 x 1.7 x 4.0 cm along the implant capsule. It is hypervascular with an echogenic rind. There is a suggestion of multiple additional masses along the margins of the implant throughout the RIGHT breast. At the 9 o'clock position 3 cm from the nipple there is a 1 cm oval hypoechoic mass. At the RIGHT breast 4 o'clock position 2 cm from the nipple there is an irregular, heterogeneous mass with indistinct margins, possibly contiguous with the skin and measuring 1.7 x 0.9 x 2.4 cm. In the RIGHT axilla there is an irregular, hypoechoic taller than wide hypoechoic mass with an echogenic rind measuring 5 x 6 x 6 mm. It is unclear if this reflects an abnormal lymph node as it lacks normal lymph node morphology. At least 2  additional irregular masses with indistinct margins are noted in the RIGHT axilla measuring up to 6 and 7 mm respectively. Targeted ultrasound of the LEFT breast was performed, demonstrating innumerable small cysts and clustered cysts. At the 12 o'clock position 4 cm from the nipple there is a 5 mm cluster of benign cysts which likely correlates with the mammographic finding above, although it is possible that the mammographic finding correlates with 1 of the innumerable other benign cysts. There is no sonographic evidence of malignancy in the LEFT breast. IMPRESSION: 1. Findings consistent  with multicentric RIGHT breast cancer with axillary disease, asymmetrically decreased size of the right breast, and suspected skin involvement, raising the possibility of inflammatory breast cancer. Sites of disease include the dominant, spiculated mass in the RIGHT breast along the implant capsule measuring up to 4.7 cm, several additional smaller breast masses in the LATERAL and MEDIAL breast, 3 RIGHT axillary masses versus morphologically abnormal lymph nodes, and diffuse RIGHT breast skin thickening with at least 1 palpable mass within the skin. 2. Ultrasound-guided biopsy of the dominant RIGHT breast mass and 1 of the axillary masses is recommended. This was subsequently performed and is separately dictated. Skin punch biopsy of the RIGHT breast is recommended to assess for the possibility of inflammatory breast cancer. 3. No mammographic or sonographic evidence of malignancy in the LEFT breast. RECOMMENDATION: Ultrasound-guided biopsy of the RIGHT breast/axilla x2 I have discussed the findings and recommendations with the patient. The recommended procedure was discussed with the patient and questions were answered. Patient expressed their understanding of the recommendation. Patient will be scheduled for the procedure at her earliest convenience by the schedulers. Ordering provider will be notified. If applicable, a reminder letter will be sent to the patient regarding the next appointment. BI-RADS CATEGORY  5: Highly suggestive of malignancy. Electronically Signed   By: Norleen Croak M.D.   On: 12/26/2024 14:46   US  LIMITED ULTRASOUND INCLUDING AXILLA LEFT BREAST  Result Date: 12/26/2024 CLINICAL DATA:  RIGHT breast palpable lump at 10-11 o'clock x1 year (perceived to be increasing in size). Has heaviness in the RIGHT arm with radiating pain from the LATERAL to MEDIAL superior breast. Also with RIGHT breast pea-sized lump at 9 o'clock x3 months. EXAM: DIGITAL DIAGNOSTIC BILATERAL MAMMOGRAM WITH IMPLANTS,  TOMOSYNTHESIS AND CAD; ULTRASOUND RIGHT BREAST LIMITED; ULTRASOUND LEFT BREAST LIMITED TECHNIQUE: Bilateral digital diagnostic mammography and breast tomosynthesis was performed. Standard and/or implant displaced views were performed. The images were evaluated with computer-aided detection. ; Targeted ultrasound examination of the right breast was performed; Targeted ultrasound examination of the left breast was performed. COMPARISON:  Previous exam(s). ACR Breast Density Category c: The breasts are heterogeneously dense, which may obscure small masses. FINDINGS: The patient has prepectoral implants. Postsurgical changes of LEFT breast excisional biopsy. Diffuse LEFT breast calcifications without suspicious focal group. An approximately 5 mm oval circumscribed mass at the 12 to 1 o'clock position in the LEFT breast is incidentally noted. Ultrasound was performed. Diffuse RIGHT breast skin thickening and asymmetric decrease in size of the RIGHT breast. In the upper-outer quadrant of the RIGHT breast at the posterior depth underlying a palpable marker there is a partially visualized spiculated, irregular mass along the implant capsule, likely corresponding with the palpable abnormality. An additional palpable marker is located slightly more inferiorly, with underlying skin thickening but no discrete breast abnormality. Targeted ultrasound of the RIGHT breast was performed. At the site of palpable abnormality in the RIGHT breast at 9 o'clock 5 cm from the  nipple there is a visible skin papule within the region of skin thickening. Targeted ultrasound demonstrates a 4 mm oval hypoechoic mass with indistinct margins located within the skin at the site of the palpable finding. Representative pictures were taken of the RIGHT breast skin thickening described above, which is present both laterally and medially and measures up to 4 mm in diameter. At the site of palpable abnormality in the RIGHT breast at 10 o'clock 6 cm from  the nipple there is an irregular, spiculated mass measuring 4.7 x 1.7 x 4.0 cm along the implant capsule. It is hypervascular with an echogenic rind. There is a suggestion of multiple additional masses along the margins of the implant throughout the RIGHT breast. At the 9 o'clock position 3 cm from the nipple there is a 1 cm oval hypoechoic mass. At the RIGHT breast 4 o'clock position 2 cm from the nipple there is an irregular, heterogeneous mass with indistinct margins, possibly contiguous with the skin and measuring 1.7 x 0.9 x 2.4 cm. In the RIGHT axilla there is an irregular, hypoechoic taller than wide hypoechoic mass with an echogenic rind measuring 5 x 6 x 6 mm. It is unclear if this reflects an abnormal lymph node as it lacks normal lymph node morphology. At least 2 additional irregular masses with indistinct margins are noted in the RIGHT axilla measuring up to 6 and 7 mm respectively. Targeted ultrasound of the LEFT breast was performed, demonstrating innumerable small cysts and clustered cysts. At the 12 o'clock position 4 cm from the nipple there is a 5 mm cluster of benign cysts which likely correlates with the mammographic finding above, although it is possible that the mammographic finding correlates with 1 of the innumerable other benign cysts. There is no sonographic evidence of malignancy in the LEFT breast. IMPRESSION: 1. 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 Sites of disease include the dominant, spiculated mass in the RIGHT breast along the implant capsule measuring up to 4.7 cm, several additional smaller breast masses in the LATERAL and MEDIAL breast, 3 RIGHT axillary masses versus morphologically abnormal lymph nodes, and diffuse RIGHT breast skin thickening with at least 1 palpable mass within the skin. 2.  Ultrasound-guided biopsy of the dominant RIGHT breast mass and 1 of the axillary masses is recommended. This was subsequently performed and is separately dictated. Skin punch biopsy of the RIGHT breast is recommended to assess for the possibility of inflammatory breast cancer. 3. No mammographic or sonographic evidence of malignancy in the LEFT breast. RECOMMENDATION: Ultrasound-guided biopsy of the RIGHT breast/axilla x2 I have discussed the findings and recommendations with the patient. The recommended procedure was discussed with the patient and questions were answered. Patient expressed their understanding of the recommendation. Patient will be scheduled for the procedure at her earliest convenience by the schedulers. Ordering provider will be notified. If applicable, a reminder letter will be sent to the patient regarding the next appointment. BI-RADS CATEGORY  5: Highly suggestive of malignancy. Electronically Signed   By: Norleen Croak M.D.   On: 12/26/2024 14:46     Assessment and plan- Patient is a 49 y.o. 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 No distant metastasis. Cervical spine uptake at C5 not overtly concerning for malignancy.  I will plan to repeat her MRI in 3 months   Neoadjuvant chemotherapy indicated to downstage disease. Surgical plan includes mastectomy and axillary lymph node dissection post-chemotherapy. - Initiated neoadjuvant AC chemotherapy (doxorubicin  and cyclophosphamide ) every two weeks for four cycles. - Ordered follow-up breast ultrasound after four cycles of AC chemotherapy to assess response. - If disease is stable or shrinking after AC,  proceed to complete planned chemotherapy; if progression, proceed to surgery. - She will proceed with planned mastectomy and axillary lymph node dissection after chemotherapy. - Repeat MRI of the cervical spine in three months to monitor C5 uptake. - Reviewed negative genetic testing results.  Antineoplastic chemotherapy administration Initiated neoadjuvant AC chemotherapy. Supportive care measures in place for anticipated side effects. Pegfilgrastim  used post-AC cycle to reduce neutropenia risk. Scalp cooling discussed but not recommended. - Administered AC chemotherapy (doxorubicin  and cyclophosphamide ) every two weeks for four cycles. - Applied pegfilgrastim  on-body injector patch after each AC cycle to reduce neutropenia risk. - Instructed to monitor patch for color change and remove after injection is complete. - Prescribed loratadine (Claritin) for 3-4 days after each pegfilgrastim  patch to reduce bone pain/arthralgia. - Advised acetaminophen  for bone pain/arthralgia if loratadine is insufficient, but not to use acetaminophen  to suppress fever or if signs of infection develop. - Prescribed dexamethasone  to be taken in the morning for three days starting the day after chemotherapy, with food, regardless of nausea. - Prescribed prochlorperazine  and ondansetron  as needed for nausea, with instructions to try prochlorperazine  first, then ondansetron  if needed. - Advised to maintain regular activity and nutrition to improve chemotherapy tolerance. - Monitored complete blood count at each visit prior to chemotherapy administration.          Cancer Staging  Malignant neoplasm of overlapping sites of right breast in female, estrogen receptor positive (HCC) Staging form: Breast, AJCC 8th Edition - Clinical stage from 01/15/2025: Stage IIA (cT3, cN1, cM0, G2, ER+, PR+, HER2-) - Signed by Melanee Annah BROCKS, MD on 01/15/2025 Histopathologic type: Adenocarcinoma, NOS Stage prefix: Initial  diagnosis Histologic grading system: 3 grade system     Visit Diagnosis 1. Encounter for antineoplastic chemotherapy      Dr. Annah Melanee, MD, MPH [facility] at [facility] [phone] 01/15/2025 10:23 AM                    [1] No Known Allergies [2]  Current Outpatient Medications:    acetaminophen  (TYLENOL ) 325 MG tablet, Take 2 tablets (650 mg total) by mouth every 8 (eight) hours as needed for mild pain (pain score 1-3)., Disp: 40 tablet, Rfl: 0   dexamethasone  (DECADRON ) 4 MG tablet, Take 8 mg by mouth See admin instructions. Take daily for 3 days. Start the day of Chemo, Disp: , Rfl:    docusate sodium  (COLACE) 100 MG capsule, Take 1 capsule (100 mg total) by mouth 2 (two) times daily as needed for up to 10 days for mild constipation., Disp: 20 capsule, Rfl: 0   ibuprofen  (ADVIL ) 800 MG tablet, Take 1 tablet (800 mg total) by mouth every 8 (eight) hours as needed for mild pain (pain score 1-3) or moderate pain (pain score 4-6)., Disp: 30 tablet, Rfl: 0   lidocaine -prilocaine  (EMLA ) cream, Apply 1 Application topically as needed., Disp: , Rfl:    ondansetron  (ZOFRAN )  8 MG tablet, Take 8 mg by mouth every 8 (eight) hours as needed for nausea or vomiting., Disp: , Rfl:    oxyCODONE -acetaminophen  (PERCOCET) 5-325 MG tablet, Take 1 tablet by mouth every 8 (eight) hours as needed for severe pain (pain score 7-10)., Disp: 6 tablet, Rfl: 0   prochlorperazine  (COMPAZINE ) 10 MG tablet, Take 10 mg by mouth every 6 (six) hours as needed for nausea or vomiting., Disp: , Rfl:   "

## 2025-01-15 NOTE — Patient Instructions (Signed)

## 2025-01-15 NOTE — Progress Notes (Signed)
 Patient doing okay; no new or acute concerns at this time.  This will be her first treatment!

## 2025-01-15 NOTE — Patient Instructions (Signed)
 [facility] - A DEPT OF [facility]  Discharge Instructions: Thank you for choosing [facility] to provide your oncology and hematology care.  If you have a lab appointment with the Cancer Center, please go directly to the Cancer Center and check in at the registration area.  Wear comfortable clothing and clothing appropriate for easy access to any Portacath or PICC line.   We strive to give you quality time with your provider. You may need to reschedule your appointment if you arrive late (15 or more minutes).  Arriving late affects you and other patients whose appointments are after yours.  Also, if you miss three or more appointments without notifying the office, you may be dismissed from the clinic at the provider's discretion.      For prescription refill requests, have your pharmacy contact our office and allow 72 hours for refills to be completed.    Today you received the following chemotherapy and/or immunotherapy agents- adriamycin, cytoxan      To help prevent nausea and vomiting after your treatment, we encourage you to take your nausea medication as directed.  BELOW ARE SYMPTOMS THAT SHOULD BE REPORTED IMMEDIATELY: *FEVER GREATER THAN 100.4 F (38 C) OR HIGHER *CHILLS OR SWEATING *NAUSEA AND VOMITING THAT IS NOT CONTROLLED WITH YOUR NAUSEA MEDICATION *UNUSUAL SHORTNESS OF BREATH *UNUSUAL BRUISING OR BLEEDING *URINARY PROBLEMS (pain or burning when urinating, or frequent urination) *BOWEL PROBLEMS (unusual diarrhea, constipation, pain near the anus) TENDERNESS IN MOUTH AND THROAT WITH OR WITHOUT PRESENCE OF ULCERS (sore throat, sores in mouth, or a toothache) UNUSUAL RASH, SWELLING OR PAIN  UNUSUAL VAGINAL DISCHARGE OR ITCHING   Items with * indicate a potential emergency and should be followed up as soon as possible or go to the Emergency Department if any problems should occur.  Please show the CHEMOTHERAPY ALERT CARD or  IMMUNOTHERAPY ALERT CARD at check-in to the Emergency Department and triage nurse.  Should you have questions after your visit or need to cancel or reschedule your appointment, please contact [facility] - A DEPT OF [facility]  [phone] and follow the prompts.  Office hours are 8:00 a.m. to 4:30 p.m. Monday - Friday. Please note that voicemails left after 4:00 p.m. may not be returned until the following business day.  We are closed weekends and major holidays. You have access to a nurse at all times for urgent questions. Please call the main number to the clinic [phone] and follow the prompts.  For any non-urgent questions, you may also contact your provider using MyChart. We now offer e-Visits for anyone 83 and older to request care online for non-urgent symptoms. For details visit [email or web address].   Also download the MyChart app! Go to the app store, search MyChart, open the app, select [place], and log in with your MyChart username and password.

## 2025-01-30 ENCOUNTER — Inpatient Hospital Stay

## 2025-01-30 ENCOUNTER — Inpatient Hospital Stay: Admitting: Oncology

## 2025-02-04 ENCOUNTER — Inpatient Hospital Stay

## 2025-03-10 ENCOUNTER — Telehealth: Admitting: Plastic Surgery
